# Patient Record
Sex: Male | Born: 1980 | Race: White | Hispanic: No | Marital: Single | Smoking: Current every day smoker
Health system: Southern US, Community
[De-identification: ages and names within clinical notes are randomized; demographics above are authoritative.]

## PROBLEM LIST (undated history)

## (undated) DIAGNOSIS — K219 Gastro-esophageal reflux disease without esophagitis: Secondary | ICD-10-CM

## (undated) DIAGNOSIS — K610 Anal abscess: Secondary | ICD-10-CM

## (undated) HISTORY — DX: Anal abscess: K61.0

## (undated) HISTORY — PX: NO PAST SURGERIES: SHX2092

## (undated) HISTORY — DX: Gastro-esophageal reflux disease without esophagitis: K21.9

## (undated) HISTORY — PX: UPPER GASTROINTESTINAL ENDOSCOPY: SHX188

---

## 2015-05-06 ENCOUNTER — Ambulatory Visit (INDEPENDENT_AMBULATORY_CARE_PROVIDER_SITE_OTHER): Payer: 59 | Admitting: Physician Assistant

## 2015-05-06 VITALS — BP 132/82 | HR 88 | Temp 98.2°F | Resp 16 | Ht 73.0 in | Wt 203.0 lb

## 2015-05-06 DIAGNOSIS — Z8042 Family history of malignant neoplasm of prostate: Secondary | ICD-10-CM | POA: Diagnosis not present

## 2015-05-06 DIAGNOSIS — Z13220 Encounter for screening for lipoid disorders: Secondary | ICD-10-CM

## 2015-05-06 DIAGNOSIS — Z0000 Encounter for general adult medical examination without abnormal findings: Secondary | ICD-10-CM

## 2015-05-06 DIAGNOSIS — Z130 Encounter for screening for diseases of the blood and blood-forming organs and certain disorders involving the immune mechanism: Secondary | ICD-10-CM

## 2015-05-06 DIAGNOSIS — Z13228 Encounter for screening for other metabolic disorders: Secondary | ICD-10-CM | POA: Diagnosis not present

## 2015-05-06 DIAGNOSIS — Z114 Encounter for screening for human immunodeficiency virus [HIV]: Secondary | ICD-10-CM

## 2015-05-06 LAB — COMPLETE METABOLIC PANEL WITH GFR
ALT: 90 U/L — AB (ref 9–46)
AST: 35 U/L (ref 10–40)
Albumin: 4.9 g/dL (ref 3.6–5.1)
Alkaline Phosphatase: 30 U/L — ABNORMAL LOW (ref 40–115)
BILIRUBIN TOTAL: 0.3 mg/dL (ref 0.2–1.2)
BUN: 13 mg/dL (ref 7–25)
CHLORIDE: 101 mmol/L (ref 98–110)
CO2: 24 mmol/L (ref 20–31)
CREATININE: 1.2 mg/dL (ref 0.60–1.35)
Calcium: 9.9 mg/dL (ref 8.6–10.3)
GFR, EST NON AFRICAN AMERICAN: 79 mL/min (ref >=60)
GLUCOSE: 79 mg/dL (ref 65–99)
Potassium: 4.2 mmol/L (ref 3.5–5.3)
SODIUM: 140 mmol/L (ref 135–146)
TOTAL PROTEIN: 7.5 g/dL (ref 6.1–8.1)

## 2015-05-06 LAB — CBC WITH DIFFERENTIAL/PLATELET
BASOS ABS: 0 10*3/uL (ref 0.0–0.1)
Basophils Relative: 1 % (ref 0–1)
EOS ABS: 0.1 10*3/uL (ref 0.0–0.7)
Eosinophils Relative: 2 % (ref 0–5)
HEMATOCRIT: 45.7 % (ref 39.0–52.0)
Hemoglobin: 16.4 g/dL (ref 13.0–17.0)
LYMPHS ABS: 1.3 10*3/uL (ref 0.7–4.0)
LYMPHS PCT: 28 % (ref 12–46)
MCH: 31.4 pg (ref 26.0–34.0)
MCHC: 35.9 g/dL (ref 30.0–36.0)
MCV: 87.5 fL (ref 78.0–100.0)
MONOS PCT: 14 % — AB (ref 3–12)
MPV: 9.1 fL (ref 8.6–12.4)
Monocytes Absolute: 0.6 10*3/uL (ref 0.1–1.0)
NEUTROS PCT: 55 % (ref 43–77)
Neutro Abs: 2.5 10*3/uL (ref 1.7–7.7)
Platelets: 251 10*3/uL (ref 150–400)
RBC: 5.22 MIL/uL (ref 4.22–5.81)
RDW: 13.6 % (ref 11.5–15.5)
WBC: 4.6 10*3/uL (ref 4.0–10.5)

## 2015-05-06 LAB — LIPID PANEL
CHOL/HDL RATIO: 4.8 ratio (ref <=5.0)
Cholesterol: 245 mg/dL — ABNORMAL HIGH (ref 125–200)
HDL: 51 mg/dL (ref >=40)
LDL CALC: 129 mg/dL (ref <130)
Triglycerides: 327 mg/dL — ABNORMAL HIGH (ref <150)
VLDL: 65 mg/dL — AB (ref <30)

## 2015-05-06 NOTE — Progress Notes (Signed)
Frank Galvan  MRN: [id number] DOB: [birth year]  Subjective:  Pt presents to clinic for a CPE.  No concerns.  Last dental exam: every 6 months Last vision exam: never Vaccines        Tetanus - declined      There are no active problems to display for this patient.   No current outpatient prescriptions on file prior to visit.   No current facility-administered medications on file prior to visit.    No Known Allergies  Social History   Social History  . Marital Status: Single    Spouse Name: N/A  . Number of Children: N/A  . Years of Education: N/A   Social History Main Topics  . Smoking status: Current Every Day Smoker -- 0.25 packs/day    Types: Cigarettes  . Smokeless tobacco: Never Used  . Alcohol Use: 0.0 oz/week    0 Standard drinks or equivalent per week     Comment: 6 beers a day  . Drug Use: Yes    Special: Marijuana     Comment: rare social  . Sexual Activity:    Partners: Female   Other Topics Concern  . 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 No pertinent past surgical history.  Family History  Problem Relation Age of Onset  . Cancer Father   . Stroke Maternal Grandfather     Review of Systems  Constitutional: Negative.   HENT: Negative.   Eyes: Negative.   Respiratory: Negative.   Cardiovascular: Negative.   Gastrointestinal: Negative.   Endocrine: Negative.   Genitourinary: Negative.   Musculoskeletal: Negative.   Skin: Negative.   Allergic/Immunologic: Negative.   Neurological: Negative.   Hematological: Negative.   Psychiatric/Behavioral: Negative.    Objective:  BP 132/82 mmHg  Pulse 88  Temp(Src) 98.2 F (36.8 C) (Oral)  Resp 16  Ht 6\' 1"  (1.854 m)  Wt 203 lb (92.08 kg)  BMI 26.79 kg/m2  SpO2 98%  Physical Exam  Constitutional: He is oriented to person, place, and time and well-developed, well-nourished, and in no distress.    HENT:  Head: Normocephalic and atraumatic.  Right Ear: Hearing, tympanic membrane, external ear and ear canal normal.  Left Ear: Hearing, tympanic membrane, external ear and ear canal normal.  Nose: Nose normal.  Mouth/Throat: Uvula is midline, oropharynx is clear and moist and mucous membranes are normal.  Piercing in right lip  Eyes: Conjunctivae and EOM are normal. Pupils are equal, round, and reactive to light.  Neck: Trachea normal and normal range of motion. Neck supple. No thyroid mass and no thyromegaly present.  Cardiovascular: Normal rate, regular rhythm and normal heart sounds.   No murmur heard. Pulmonary/Chest: Effort normal and breath sounds normal.  Abdominal: Soft. Bowel sounds are normal. No hernia. Hernia confirmed negative in the right inguinal area and confirmed negative in the left inguinal area.  Genitourinary: Testes/scrotum normal and penis normal.  Musculoskeletal: Normal range of motion.  Neurological: He is alert and oriented to person, place, and time. Gait normal.  Skin: Skin is warm and dry.  Psychiatric: Mood, memory, affect and judgment normal.    Visual Acuity Screening   Right eye Left eye Both eyes  Without correction: 20/10-1 20/10-1 20/10-1  With correction:       Assessment and Plan :  Annual physical exam  Family history of prostate  cancer - Plan: PSA  Screening for HIV (human immunodeficiency virus) - Plan: HIV antibody  Screening for metabolic disorder - Plan: COMPLETE METABOLIC PANEL WITH GFR  Screening for deficiency anemia - Plan: CBC with Differential/Platelet  Screening cholesterol level - Plan: Lipid panel  Windell Hummingbird PA-C  [facility] 05/07/2015 12:07 PM

## 2015-05-06 NOTE — Patient Instructions (Addendum)
   IF you received an x-ray today, you will receive an invoice from [facility]. Please contact [facility] at [phone] with questions or concerns regarding your invoice.   IF you received labwork today, you will receive an invoice from [facility]. Please contact [facility] at [phone] with questions or concerns regarding your invoice.   Our billing staff will not be able to assist you with questions regarding bills from these companies.  You will be contacted with the lab results as soon as they are available. The fastest way to get your results is to activate your My Chart account. Instructions are located on the last page of this paperwork. If you have not heard from us regarding the results in 2 weeks, please contact this office.    Keeping you healthy  Get these tests  Blood pressure- Have your blood pressure checked once a year by your healthcare provider.  Normal blood pressure is 120/80.  Weight- Have your body mass index (BMI) calculated to screen for obesity.  BMI is a measure of body fat based on height and weight. You can also calculate your own BMI at www.nhlbisupport.com/bmi/.  Cholesterol- Have your cholesterol checked regularly starting at age 35, sooner may be necessary if you have diabetes, high blood pressure, if a family member developed heart diseases at an early age or if you smoke.   Chlamydia, HIV, and other sexual transmitted disease- Get screened each year until the age of 25 then within three months of each new sexual partner.  Diabetes- Have your blood sugar checked regularly if you have high blood pressure, high cholesterol, a family history of diabetes or if you are overweight.  Get these vaccines  Flu shot- Every fall.  Tetanus shot- Every 10 years.  Menactra- Single dose; prevents meningitis.  Take these steps  Don't smoke- If you do smoke, ask your healthcare provider about quitting. For tips on  how to quit, go to www.smokefree.gov or call 1-800-QUIT-NOW.  Be physically active- Exercise 5 days a week for at least 30 minutes.  If you are not already physically active start slow and gradually work up to 30 minutes of moderate physical activity.  Examples of moderate activity include walking briskly, mowing the yard, dancing, swimming bicycling, etc.  Eat a healthy diet- Eat a variety of healthy foods such as fruits, vegetables, low fat milk, low fat cheese, yogurt, lean meats, poultry, fish, beans, tofu, etc.  For more information on healthy eating, go to www.thenutritionsource.org  Drink alcohol in moderation- Limit alcohol intake two drinks or less a day.  Never drink and drive.  Dentist- Brush and floss teeth twice daily; visit your dentis twice a year.  Depression-Your emotional health is as important as your physical health.  If you're feeling down, losing interest in things you normally enjoy please talk with your healthcare provider.  Gun Safety- If you keep a gun in your home, keep it unloaded and with the safety lock on.  Bullets should be stored separately.  Helmet use- Always wear a helmet when riding a motorcycle, bicycle, rollerblading or skateboarding.  Safe sex- If you may be exposed to a sexually transmitted infection, use a condom  Seat belts- Seat bels can save your life; always wear one.  Smoke/Carbon Monoxide detectors- These detectors need to be installed on the appropriate level of your home.  Replace batteries at least once a year.  Skin Cancer- When out in the sun, cover up and use sunscreen SPF   15 or higher.  Violence- If anyone is threatening or hurting you, please tell your healthcare provider. 

## 2015-05-07 ENCOUNTER — Encounter: Payer: Self-pay | Admitting: Physician Assistant

## 2015-05-07 DIAGNOSIS — E782 Mixed hyperlipidemia: Secondary | ICD-10-CM | POA: Insufficient documentation

## 2015-05-07 LAB — HIV ANTIBODY (ROUTINE TESTING W REFLEX): HIV: NONREACTIVE

## 2015-05-07 LAB — PSA: PSA: 0.51 ng/mL (ref <=4.00)

## 2015-07-28 ENCOUNTER — Ambulatory Visit (INDEPENDENT_AMBULATORY_CARE_PROVIDER_SITE_OTHER): Payer: 59 | Admitting: Family Medicine

## 2015-07-28 VITALS — BP 128/64 | HR 81 | Temp 98.4°F | Resp 16 | Ht 73.0 in | Wt 206.4 lb

## 2015-07-28 DIAGNOSIS — J387 Other diseases of larynx: Secondary | ICD-10-CM | POA: Diagnosis not present

## 2015-07-28 DIAGNOSIS — R499 Unspecified voice and resonance disorder: Secondary | ICD-10-CM | POA: Diagnosis not present

## 2015-07-28 DIAGNOSIS — Z720 Tobacco use: Secondary | ICD-10-CM | POA: Diagnosis not present

## 2015-07-28 DIAGNOSIS — R05 Cough: Secondary | ICD-10-CM | POA: Diagnosis not present

## 2015-07-28 DIAGNOSIS — R058 Other specified cough: Secondary | ICD-10-CM

## 2015-07-28 DIAGNOSIS — F1010 Alcohol abuse, uncomplicated: Secondary | ICD-10-CM

## 2015-07-28 DIAGNOSIS — K219 Gastro-esophageal reflux disease without esophagitis: Secondary | ICD-10-CM

## 2015-07-28 MED ORDER — OMEPRAZOLE 20 MG PO CPDR: 20.0000 mg | DELAYED_RELEASE_CAPSULE | Freq: Every day | ORAL | Status: DC

## 2015-07-28 NOTE — Progress Notes (Signed)
By signing my name below, I, Frank Galvan, attest that this documentation has been prepared under the direction and in the presence of Frank Ray, MD.  Electronically Signed: Verlee Galvan, Medical Scribe. 07/28/2015. 3:39 PM.  Subjective:    Patient ID: Frank Galvan, male    DOB: [birth year], 35 y.o.   MRN: [id number]  HPI Chief Complaint  Patient presents with  . Sore Throat    x 24month    HPI Comments: Frank Galvan is a 35 y.o. male who presents to the [facility] complaining of intermittent sore throat onset about 3 months. Pt states its on the left side under his jaw and moves around his throat. Pt occasionally has swollen glands. Pt has sore throat in the morning, and occasionally it wont in the day, but will come back at night. Pt has an occasional dry cough-nothing new. Pt states he has been having more heart burn in the past year. Pt uses Tums and a generic version of Pepcid compete for relief from heartburn. Pt states his heartburn occurs frequently, every day. Pt denies coughing up blood, night sweats, or fever. Pt denies having any hx of cancer.  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 Pt denies being exposed to dust. The places he goes to are monitored for particles and would know ahead of time before heading in. Things have been busy at work.  SHx: Pt uses e-cigarettes now for about a year. Pt has been smoking steady for about 18 years. Pt drinks 6 beers a day. Pt denies having any DUI or any work related incidents. Pt denies having any FHx of EtOH abuse.Pt used to do screaming/death metal music and he felt a pain in he same spot of his throat about a year ago. Pt lost his voice at a bachelor party weekend, and then he started having heartburn. Pt has had a voice change since then. Pt mentions when his parents talk to him on the phone they say he sounds sick.   Patient Active  Problem List   Diagnosis Date Noted  . Elevated triglycerides with high cholesterol 05/07/2015   History reviewed. No pertinent past medical history. History reviewed. No pertinent past surgical history. No Known Allergies Prior to Admission medications   Not on File   Social History   Social History  . Marital Status: Single    Spouse Name: N/A  . Number of Children: N/A  . Years of Education: N/A   Occupational History  . Not on file.   Social History Main Topics  . Smoking status: Current Every Day Smoker -- 0.25 packs/day    Types: Cigarettes  . Smokeless tobacco: Never Used  . Alcohol Use: 0.0 oz/week    0 Standard drinks or equivalent per week     Comment: 6 beers a day  . Drug Use: Yes    Special: Marijuana     Comment: rare social  . Sexual Activity:    Partners: Female   Other Topics Concern  . Not on file   Social History Narrative   Single   No children   Engineer, site   No current exercise       Review of Systems  Constitutional: Negative for fever and diaphoresis.  HENT: Positive for sore throat and voice change. Negative for trouble swallowing.   Respiratory: Positive for cough.   Cardiovascular: Positive for chest pain (due to  heartburn).    Objective:  BP 128/64 mmHg  Pulse 81  Temp(Src) 98.4 F (36.9 C) (Oral)  Resp 16  Ht 6\' 1"  (1.854 m)  Wt 206 lb 6.4 oz (93.622 kg)  BMI 27.24 kg/m2  SpO2 98%  Physical Exam  Constitutional: He is oriented to person, place, and time. He appears well-developed and well-nourished. No distress.  HENT:  Head: Normocephalic and atraumatic.  Right Ear: Tympanic membrane, external ear and ear canal normal.  Left Ear: Tympanic membrane, external ear and ear canal normal.  Nose: No rhinorrhea.  Mouth/Throat: Oropharynx is clear and moist and mucous membranes are normal. No oropharyngeal exudate or posterior oropharyngeal erythema.  No visual masses in his mouth Posterior oropharynx appears nl    Eyes: Conjunctivae are normal. Pupils are equal, round, and reactive to light.  Neck: Neck supple.  Cardiovascular: Normal rate, regular rhythm, normal heart sounds and intact distal pulses.   No murmur heard. Pulmonary/Chest: Effort normal and breath sounds normal. He has no wheezes. He has no rhonchi. He has no rales.  Abdominal: Soft. There is no tenderness.  Lymphadenopathy:    He has no cervical adenopathy.  No appreciable lymph adenopathy No lumps in the neck  Neurological: He is alert and oriented to person, place, and time.  Skin: Skin is warm and dry. No rash noted.  Psychiatric: He has a normal mood and affect. His behavior is normal.  Nursing note and vitals reviewed.   Assessment & Plan:   [name] is a 36 y.o. male Laryngopharyngeal reflux (LPR) - Plan: omeprazole (PRILOSEC) 20 MG capsule, Ambulatory referral to ENT  Dry cough - Plan: Ambulatory referral to ENT  Change in voice - Plan: Ambulatory referral to ENT  Tobacco abuse  Alcohol abuse  Sore throat, with voice change over the past year to year and a half. Initial episode of laryngitis/voice loss, but persistent voice change over past year. Some persistent heartburn symptoms, so laryngopharyngeal reflux likely, but with 15 years of tobacco abuse, will refer to ENT for further evaluation and possible laryngoscopy.  -Refer to ENT  - Start omeprazole 20 mg daily  -Decrease alcohol use and tobacco use. Advised let me know if he has difficulty cutting back on alcohol.  -Handout given on other trigger foods to avoid for heartburn.  -Follow-up in next 6 weeks - sooner if worse.   Meds ordered this encounter  Medications  . omeprazole (PRILOSEC) 20 MG capsule    Sig: Take 1 capsule (20 mg total) by mouth daily.    Dispense:  30 capsule    Refill:  1   Patient Instructions       IF you received an x-Galvan today, you will receive an invoice from [facility]. Please contact [facility] at  [phone] with questions or concerns regarding your invoice.   IF you received labwork today, you will receive an invoice from [facility]. Please contact [facility] at [phone] with questions or concerns regarding your invoice.   Our billing staff will not be able to assist you with questions regarding bills from these companies.  You will be contacted with the lab results as soon as they are available. The fastest way to get your results is to activate your My Chart account. Instructions are located on the last page of this paperwork. If you have not heard from [place] regarding the results in 2 weeks, please contact this office.     Start omeprazole once per day to help  with heartburn. However as we discussed, it is important to cut back on alcohol use as well as tobacco use as much as possible. See other information below on foods to avoid with heartburn. I will also refer you to Ear, Nose and Throat for further evaluation and possible laryngoscopy based on how long you've had these symptoms. Follow-up with me in the next 4-6 weeks, sooner if worse.  Food Choices for Gastroesophageal Reflux Disease, Adult When you have gastroesophageal reflux disease (GERD), the foods you eat and your eating habits are very important. Choosing the right foods can help ease the discomfort of GERD. WHAT GENERAL GUIDELINES DO I NEED TO FOLLOW?  Choose fruits, vegetables, whole grains, low-fat dairy products, and low-fat meat, fish, and poultry.  Limit fats such as oils, salad dressings, butter, nuts, and avocado.  Keep a food diary to identify foods that cause symptoms.  Avoid foods that cause reflux. These may be different for different people.  Eat frequent small meals instead of three large meals each day.  Eat your meals slowly, in a relaxed setting.  Limit fried foods.  Cook foods using methods other than frying.  Avoid drinking alcohol.  Avoid drinking large amounts  of liquids with your meals.  Avoid bending over or lying down until 2-3 hours after eating. WHAT FOODS ARE NOT RECOMMENDED? The following are some foods and drinks that may worsen your symptoms: Vegetables Tomatoes. Tomato juice. Tomato and spaghetti sauce. Chili peppers. Onion and garlic. Horseradish. Fruits Oranges, grapefruit, and lemon (fruit and juice). Meats High-fat meats, fish, and poultry. This includes hot dogs, ribs, ham, sausage, salami, and bacon. Dairy Whole milk and chocolate milk. Sour cream. Cream. Butter. Ice cream. Cream cheese.  Beverages Coffee and tea, with or without caffeine. Carbonated beverages or energy drinks. Condiments Hot sauce. Barbecue sauce.  Sweets/Desserts Chocolate and cocoa. Donuts. Peppermint and spearmint. Fats and Oils High-fat foods, including [place] fries and potato chips. Other Vinegar. Strong spices, such as black pepper, white pepper, red pepper, cayenne, curry powder, cloves, ginger, and chili powder. The items listed above may not be a complete list of foods and beverages to avoid. Contact your dietitian for more information.   This information is not intended to replace advice given to you by your health care provider. Make sure you discuss any questions you have with your health care provider.   Document Released: 01/23/2005 Document Revised: 02/13/2014 Document Reviewed: 11/27/2012 Elsevier Interactive Patient Education 2016 Elsevier Inc.  Sore Throat A sore throat is pain, burning, irritation, or scratchiness of the throat. There is often pain or tenderness when swallowing or talking. A sore throat may be accompanied by other symptoms, such as coughing, sneezing, fever, and swollen neck glands. A sore throat is often the first sign of another sickness, such as a cold, flu, strep throat, or mononucleosis (commonly known as mono). Most sore throats go away without medical treatment. CAUSES  The most common causes of a sore throat  include:  A viral infection, such as a cold, flu, or mono.  A bacterial infection, such as strep throat, tonsillitis, or whooping cough.  Seasonal allergies.  Dryness in the air.  Irritants, such as smoke or pollution.  Gastroesophageal reflux disease (GERD). HOME CARE INSTRUCTIONS   Only take over-the-counter medicines as directed by your caregiver.  Drink enough fluids to keep your urine clear or pale yellow.  Rest as needed.  Try using throat sprays, lozenges, or sucking on hard candy to ease any pain (if  older than 4 years or as directed).  Sip warm liquids, such as broth, herbal tea, or warm water with honey to relieve pain temporarily. You may also eat or drink cold or frozen liquids such as frozen ice pops.  Gargle with salt water (mix 1 tsp salt with 8 oz of water).  Do not smoke and avoid secondhand smoke.  Put a cool-mist humidifier in your bedroom at night to moisten the air. You can also turn on a hot shower and sit in the bathroom with the door closed for 5-10 minutes. SEEK IMMEDIATE MEDICAL CARE IF:  You have difficulty breathing.  You are unable to swallow fluids, soft foods, or your saliva.  You have increased swelling in the throat.  Your sore throat does not get better in 7 days.  You have nausea and vomiting.  You have a fever or persistent symptoms for more than 2-3 days.  You have a fever and your symptoms suddenly get worse. MAKE SURE YOU:   Understand these instructions.  Will watch your condition.  Will get help right away if you are not doing well or get worse.   This information is not intended to replace advice given to you by your health care provider. Make sure you discuss any questions you have with your health care provider.   Document Released: 03/02/2004 Document Revised: 02/13/2014 Document Reviewed: 10/01/2011 Elsevier Interactive Patient Education [employer].      I personally performed the services described in  this documentation, which was scribed in my presence. The recorded information has been reviewed and considered, and addended by me as needed.   Signed,   Frank Ray, MD [facility].  07/28/2015 4:08 PM

## 2015-07-28 NOTE — Patient Instructions (Addendum)
IF you received an x-ray today, you will receive an invoice from [facility]. Please contact [facility] at [phone] with questions or concerns regarding your invoice.   IF you received labwork today, you will receive an invoice from [employer]. Please contact [facility] at [phone] with questions or concerns regarding your invoice.   Our billing staff will not be able to assist you with questions regarding bills from these companies.  You will be contacted with the lab results as soon as they are available. The fastest way to get your results is to activate your My Chart account. Instructions are located on the last page of this paperwork. If you have not heard from [place] regarding the results in 2 weeks, please contact this office.     Start omeprazole once per day to help with heartburn. However as we discussed, it is important to cut back on alcohol use as well as tobacco use as much as possible. See other information below on foods to avoid with heartburn. I will also refer you to Ear, Nose and Throat for further evaluation and possible laryngoscopy based on how long you've had these symptoms. Follow-up with me in the next 4-6 weeks, sooner if worse.  Food Choices for Gastroesophageal Reflux Disease, Adult When you have gastroesophageal reflux disease (GERD), the foods you eat and your eating habits are very important. Choosing the right foods can help ease the discomfort of GERD. WHAT GENERAL GUIDELINES DO I NEED TO FOLLOW?  Choose fruits, vegetables, whole grains, low-fat dairy products, and low-fat meat, fish, and poultry.  Limit fats such as oils, salad dressings, butter, nuts, and avocado.  Keep a food diary to identify foods that cause symptoms.  Avoid foods that cause reflux. These may be different for different people.  Eat frequent small meals instead of three large meals each day.  Eat your meals slowly, in a relaxed  setting.  Limit fried foods.  Cook foods using methods other than frying.  Avoid drinking alcohol.  Avoid drinking large amounts of liquids with your meals.  Avoid bending over or lying down until 2-3 hours after eating. WHAT FOODS ARE NOT RECOMMENDED? The following are some foods and drinks that may worsen your symptoms: Vegetables Tomatoes. Tomato juice. Tomato and spaghetti sauce. Chili peppers. Onion and garlic. Horseradish. Fruits Oranges, grapefruit, and lemon (fruit and juice). Meats High-fat meats, fish, and poultry. This includes hot dogs, ribs, ham, sausage, salami, and bacon. Dairy Whole milk and chocolate milk. Sour cream. Cream. Butter. Ice cream. Cream cheese.  Beverages Coffee and tea, with or without caffeine. Carbonated beverages or energy drinks. Condiments Hot sauce. Barbecue sauce.  Sweets/Desserts Chocolate and cocoa. Donuts. Peppermint and spearmint. Fats and Oils High-fat foods, including [place] fries and potato chips. Other Vinegar. Strong spices, such as black pepper, white pepper, red pepper, cayenne, curry powder, cloves, ginger, and chili powder. The items listed above may not be a complete list of foods and beverages to avoid. Contact your dietitian for more information.   This information is not intended to replace advice given to you by your health care provider. Make sure you discuss any questions you have with your health care provider.   Document Released: 01/23/2005 Document Revised: 02/13/2014 Document Reviewed: 11/27/2012 Elsevier Interactive Patient Education 2016 Elsevier Inc.  Sore Throat A sore throat is pain, burning, irritation, or scratchiness of the throat. There is often pain or tenderness when swallowing or talking. A sore throat may be accompanied  by other symptoms, such as coughing, sneezing, fever, and swollen neck glands. A sore throat is often the first sign of another sickness, such as a cold, flu, strep throat, or  mononucleosis (commonly known as mono). Most sore throats go away without medical treatment. CAUSES  The most common causes of a sore throat include:  A viral infection, such as a cold, flu, or mono.  A bacterial infection, such as strep throat, tonsillitis, or whooping cough.  Seasonal allergies.  Dryness in the air.  Irritants, such as smoke or pollution.  Gastroesophageal reflux disease (GERD). HOME CARE INSTRUCTIONS   Only take over-the-counter medicines as directed by your caregiver.  Drink enough fluids to keep your urine clear or pale yellow.  Rest as needed.  Try using throat sprays, lozenges, or sucking on hard candy to ease any pain (if older than 4 years or as directed).  Sip warm liquids, such as broth, herbal tea, or warm water with honey to relieve pain temporarily. You may also eat or drink cold or frozen liquids such as frozen ice pops.  Gargle with salt water (mix 1 tsp salt with 8 oz of water).  Do not smoke and avoid secondhand smoke.  Put a cool-mist humidifier in your bedroom at night to moisten the air. You can also turn on a hot shower and sit in the bathroom with the door closed for 5-10 minutes. SEEK IMMEDIATE MEDICAL CARE IF:  You have difficulty breathing.  You are unable to swallow fluids, soft foods, or your saliva.  You have increased swelling in the throat.  Your sore throat does not get better in 7 days.  You have nausea and vomiting.  You have a fever or persistent symptoms for more than 2-3 days.  You have a fever and your symptoms suddenly get worse. MAKE SURE YOU:   Understand these instructions.  Will watch your condition.  Will get help right away if you are not doing well or get worse.   This information is not intended to replace advice given to you by your health care provider. Make sure you discuss any questions you have with your health care provider.   Document Released: 03/02/2004 Document Revised: 02/13/2014  Document Reviewed: 10/01/2011 Elsevier Interactive Patient Education [employer].

## 2015-07-29 ENCOUNTER — Telehealth: Payer: Self-pay

## 2015-07-29 NOTE — Telephone Encounter (Signed)
Pt has questions about the medication that was given on 07/28/15 a bout how to take it with or without food  Best number [phone]

## 2015-07-30 ENCOUNTER — Telehealth: Payer: Self-pay | Admitting: *Deleted

## 2015-07-30 NOTE — Telephone Encounter (Signed)
Called left message in voice mail to take medication at least 30 minutes before eating and avoid fatty foods, decrease alcohol intake and citrus foods, per AVS directions given to him at the office visit.

## 2015-08-17 NOTE — Telephone Encounter (Signed)
[facility] states patient was referred to them and is unable to reach the patient via phone.  They will try to reach the patient via e-mail.

## 2015-09-29 ENCOUNTER — Other Ambulatory Visit: Payer: Self-pay | Admitting: Family Medicine

## 2015-09-29 DIAGNOSIS — K219 Gastro-esophageal reflux disease without esophagitis: Secondary | ICD-10-CM

## 2016-04-28 ENCOUNTER — Ambulatory Visit (INDEPENDENT_AMBULATORY_CARE_PROVIDER_SITE_OTHER): Payer: 59 | Admitting: Family Medicine

## 2016-04-28 VITALS — BP 124/80 | HR 80 | Temp 98.3°F | Resp 17 | Ht 73.0 in | Wt 207.0 lb

## 2016-04-28 DIAGNOSIS — Z8042 Family history of malignant neoplasm of prostate: Secondary | ICD-10-CM

## 2016-04-28 DIAGNOSIS — Z0000 Encounter for general adult medical examination without abnormal findings: Secondary | ICD-10-CM

## 2016-04-28 DIAGNOSIS — E781 Pure hyperglyceridemia: Secondary | ICD-10-CM

## 2016-04-28 DIAGNOSIS — F17200 Nicotine dependence, unspecified, uncomplicated: Secondary | ICD-10-CM | POA: Diagnosis not present

## 2016-04-28 NOTE — Progress Notes (Signed)
Chief Complaint  Patient presents with  . Annual Exam    Subjective:  Frank Galvan is a 36 y.o. male here for a health maintenance visit.  Patient is established pt  Patient Active Problem List   Diagnosis Date Noted  . Elevated triglycerides with high cholesterol 05/07/2015    No past medical history on file.  No past surgical history on file.   Outpatient Medications Prior to Visit  Medication Sig Dispense Refill  . omeprazole (PRILOSEC) 20 MG capsule Take 1 capsule (20 mg total) by mouth daily. 30 capsule 1   No facility-administered medications prior to visit.     No Known Allergies   Family History  Problem Relation Age of Onset  . Cancer Father   . Stroke Maternal Grandfather      Health Habits: Dental Exam: up to date Eye Exam: up to date Exercise:  times/week on average Current exercise activities: walking/running Diet: occasionally consumes balanced  Social History   Social History  . Marital status: Single    Spouse name: N/A  . Number of children: N/A  . Years of education: N/A   Occupational History  . Not on file.   Social History Main Topics  . Smoking status: Current Every Day Smoker    Packs/day: 0.25    Years: 16.00    Types: Cigarettes  . Smokeless tobacco: Never Used  . Alcohol use 0.0 oz/week     Comment: 6 beers a day  . Drug use: Yes    Types: Marijuana     Comment: rare social  . Sexual activity: Yes    Partners: Female   Other Topics Concern  . Not on file   Social History Narrative   Single   No children   Engineer, site   No current exercise      History  Alcohol Use  . 0.0 oz/week    Comment: 6 beers a day   History  Smoking Status  . Current Every Day Smoker  . Packs/day: 0.25  . Years: 16.00  . Types: Cigarettes  Smokeless Tobacco  . Never Used   History  Drug Use  . Types: Marijuana    Comment: rare social     Health Maintenance: See under health Maintenance activity for review of  completion dates as well.  There is no immunization history on file for this patient.    Depression Screen-PHQ2/9 Depression screen [facility] 2/9 04/28/2016 07/28/2015 05/06/2015  Decreased Interest 0 0 0  Down, Depressed, Hopeless 0 0 0  PHQ - 2 Score 0 0 0      Depression Severity and Treatment Recommendations:  0-4= None  5-9= Mild / Treatment: Support, educate to call if worse; return in one month  10-14= Moderate / Treatment: Support, watchful waiting; Antidepressant or Psycotherapy  15-19= Moderately severe / Treatment: Antidepressant OR Psychotherapy  >= 20 = Major depression, severe / Antidepressant AND Psychotherapy    Review of Systems   Review of Systems  Constitutional: Negative for chills, fever and weight loss.  HENT: Negative for ear discharge, ear pain, nosebleeds and tinnitus.   Eyes: Negative for blurred vision, double vision, photophobia, pain, discharge and redness.  Respiratory: Negative for cough, shortness of breath and wheezing.   Cardiovascular: Negative for chest pain, palpitations, claudication and leg swelling.  Gastrointestinal: Negative for abdominal pain, diarrhea, heartburn, nausea and vomiting.  Genitourinary: Negative for dysuria, frequency and urgency.  Musculoskeletal: Negative for back pain, joint pain, myalgias and neck pain.  Skin: Negative for itching and rash.  Neurological: Negative for dizziness, tingling and headaches.  Psychiatric/Behavioral: Negative for depression. The patient is not nervous/anxious and does not have insomnia.     See HPI for ROS as well.    Objective:   Vitals:   04/28/16 0814  BP: 124/80  Pulse: 80  Resp: 17  Temp: 98.3 F (36.8 C)  TempSrc: Oral  SpO2: 97%  Weight: 207 lb (93.9 kg)  Height: 6\' 1"  (1.854 m)    Body mass index is 27.31 kg/m.  Physical Exam  Constitutional: He is oriented to person, place, and time. He appears well-developed and well-nourished.  HENT:  Head: Normocephalic and  atraumatic.  Right Ear: External ear normal.  Left Ear: External ear normal.  Nose: Nose normal.  Mouth/Throat: Oropharynx is clear and moist. No oropharyngeal exudate.  Eyes: Conjunctivae and EOM are normal. Right eye exhibits no discharge. Left eye exhibits no discharge.  Neck: Normal range of motion. Neck supple.  Cardiovascular: Normal rate, regular rhythm, normal heart sounds and intact distal pulses.   No murmur heard. Pulmonary/Chest: Effort normal and breath sounds normal. No respiratory distress. He has no wheezes.  Abdominal: Soft. Bowel sounds are normal. He exhibits no distension and no mass. There is no tenderness. There is no rebound and no guarding.  Musculoskeletal: Normal range of motion. He exhibits no edema.  Neurological: He is alert and oriented to person, place, and time. He has normal reflexes.  Skin: Skin is warm. No erythema.  Psychiatric: He has a normal mood and affect. His behavior is normal. Judgment and thought content normal.       Assessment/Plan:   Patient was seen for a health maintenance exam.  Counseled the patient on health maintenance issues. Reviewed her health mainteance schedule and ordered appropriate tests (see orders.) Counseled on regular exercise and weight management. Recommend regular eye exams and dental cleaning.   The following issues were addressed today for health maintenance:   Frank Galvan was seen today for annual exam.  Diagnoses and all orders for this visit:  Health maintenance examination- discussed age appropriate screenings Repeating labs from last year -     Lipid panel -     Cancel: Basic metabolic panel -     TSH -     Comprehensive metabolic panel -     CBC with Differential/Platelet  Family history of prostate cancer- will screen today and start to screen every 2 years if results normal -     PSA  Smoker- discussed that he should stop smoking especially given his family history of prostate  cancer  Hypertriglyceridemia- discussed increasing fiber, fish oil     No Follow-up on file.    Body mass index is 27.31 kg/m.:  Discussed the patient's BMI with patient. The BMI body mass index is 27.31 kg/m.     No future appointments.  Patient Instructions       IF you received an x-ray today, you will receive an invoice from [facility]. Please contact [facility] at [phone] with questions or concerns regarding your invoice.   IF you received labwork today, you will receive an invoice from [facility]. Please contact [facility] at [phone] with questions or concerns regarding your invoice.   Our billing staff will not be able to assist you with questions regarding bills from these companies.  You will be contacted with the lab results as soon as they are available. The fastest way to get your results is to activate  your My Chart account. Instructions are located on the last page of this paperwork. If you have not heard from [place] regarding the results in 2 weeks, please contact this office.      Health Maintenance, Male A healthy lifestyle and preventive care is important for your health and wellness. Ask your health care provider about what schedule of regular examinations is right for you. What should I know about weight and diet?  Eat a Healthy Diet  Eat plenty of vegetables, fruits, whole grains, low-fat dairy products, and lean protein.  Do not eat a lot of foods high in solid fats, added sugars, or salt. Maintain a Healthy Weight  Regular exercise can help you achieve or maintain a healthy weight. You should:  Do at least 150 minutes of exercise each week. The exercise should increase your heart rate and make you sweat (moderate-intensity exercise).  Do strength-training exercises at least twice a week. Watch Your Levels of Cholesterol and Blood Lipids  Have your blood tested for lipids and cholesterol every 5 years starting at 34 years  of age. If you are at high risk for heart disease, you should start having your blood tested when you are 34 years old. You may need to have your cholesterol levels checked more often if:  Your lipid or cholesterol levels are high.  You are older than 34 years of age.  You are at high risk for heart disease. What should I know about cancer screening? Many types of cancers can be detected early and may often be prevented. Lung Cancer  You should be screened every year for lung cancer if:  You are a current smoker who has smoked for at least 30 years.  You are a former smoker who has quit within the past 15 years.  Talk to your health care provider about your screening options, when you should start screening, and how often you should be screened. Colorectal Cancer  Routine colorectal cancer screening usually begins at 34 years of age and should be repeated every 5-10 years until you are 34 years old. You may need to be screened more often if early forms of precancerous polyps or small growths are found. Your health care provider may recommend screening at an earlier age if you have risk factors for colon cancer.  Your health care provider may recommend using home test kits to check for hidden blood in the stool.  A small camera at the end of a tube can be used to examine your colon (sigmoidoscopy or colonoscopy). This checks for the earliest forms of colorectal cancer. Prostate and Testicular Cancer  Depending on your age and overall health, your health care provider may do certain tests to screen for prostate and testicular cancer.  Talk to your health care provider about any symptoms or concerns you have about testicular or prostate cancer. Skin Cancer  Check your skin from head to toe regularly.  Tell your health care provider about any new moles or changes in moles, especially if:  There is a change in a mole's size, shape, or color.  You have a mole that is larger than a  pencil eraser.  Always use sunscreen. Apply sunscreen liberally and repeat throughout the day.  Protect yourself by wearing long sleeves, pants, a wide-brimmed hat, and sunglasses when outside. What should I know about heart disease, diabetes, and high blood pressure?  If you are 97-77 years of age, have your blood pressure checked every 3-5 years. If you are  6 years of age or older, have your blood pressure checked every year. You should have your blood pressure measured twice-once when you are at a hospital or clinic, and once when you are not at a hospital or clinic. Record the average of the two measurements. To check your blood pressure when you are not at a hospital or clinic, you can use:  An automated blood pressure machine at a pharmacy.  A home blood pressure monitor.  Talk to your health care provider about your target blood pressure.  If you are between 22-66 years old, ask your health care provider if you should take aspirin to prevent heart disease.  Have regular diabetes screenings by checking your fasting blood sugar level.  If you are at a normal weight and have a low risk for diabetes, have this test once every three years after the age of 9.  If you are overweight and have a high risk for diabetes, consider being tested at a younger age or more often.  A one-time screening for abdominal aortic aneurysm (AAA) by ultrasound is recommended for men aged 79-75 years who are current or former smokers. What should I know about preventing infection? Hepatitis B  If you have a higher risk for hepatitis B, you should be screened for this virus. Talk with your health care provider to find out if you are at risk for hepatitis B infection. Hepatitis C  Blood testing is recommended for:  Everyone born from [birth year] through 1963.  Anyone with known risk factors for hepatitis C. Sexually Transmitted Diseases (STDs)  You should be screened each year for STDs including gonorrhea and  chlamydia if:  You are sexually active and are younger than 34 years of age.  You are older than 34 years of age and your health care provider tells you that you are at risk for this type of infection.  Your sexual activity has changed since you were last screened and you are at an increased risk for chlamydia or gonorrhea. Ask your health care provider if you are at risk.  Talk with your health care provider about whether you are at high risk of being infected with HIV. Your health care provider may recommend a prescription medicine to help prevent HIV infection. What else can I do?  Schedule regular health, dental, and eye exams.  Stay current with your vaccines (immunizations).  Do not use any tobacco products, such as cigarettes, chewing tobacco, and e-cigarettes. If you need help quitting, ask your health care provider.  Limit alcohol intake to no more than 2 drinks per day. One drink equals 12 ounces of beer, 5 ounces of wine, or 1 ounces of hard liquor.  Do not use street drugs.  Do not share needles.  Ask your health care provider for help if you need support or information about quitting drugs.  Tell your health care provider if you often feel depressed.  Tell your health care provider if you have ever been abused or do not feel safe at home. This information is not intended to replace advice given to you by your health care provider. Make sure you discuss any questions you have with your health care provider. Document Released: 07/22/2007 Document Revised: 09/22/2015 Document Reviewed: 10/27/2014 Elsevier Interactive Patient Education  2017 [employer].

## 2016-04-28 NOTE — Patient Instructions (Addendum)
IF you received an x-ray today, you will receive an invoice from [facility] Radiology. Please contact [facility]iology at [phone] with questions or concerns regarding your invoice.   IF you received labwork today, you will receive an invoice from [facility]. Please contact [facility] at [phone] with questions or concerns regarding your invoice.   Our billing staff will not be able to assist you with questions regarding bills from these companies.  You will be contacted with the lab results as soon as they are available. The fastest way to get your results is to activate your My Chart account. Instructions are located on the last page of this paperwork. If you have not heard from [place] regarding the results in 2 weeks, please contact this office.      Health Maintenance, Male A healthy lifestyle and preventive care is important for your health and wellness. Ask your health care provider about what schedule of regular examinations is right for you. What should I know about weight and diet?  Eat a Healthy Diet  Eat plenty of vegetables, fruits, whole grains, low-fat dairy products, and lean protein.  Do not eat a lot of foods high in solid fats, added sugars, or salt. Maintain a Healthy Weight  Regular exercise can help you achieve or maintain a healthy weight. You should:  Do at least 150 minutes of exercise each week. The exercise should increase your heart rate and make you sweat (moderate-intensity exercise).  Do strength-training exercises at least twice a week. Watch Your Levels of Cholesterol and Blood Lipids  Have your blood tested for lipids and cholesterol every 5 years starting at 34 years of age. If you are at high risk for heart disease, you should start having your blood tested when you are 35 years old. You may need to have your cholesterol levels checked more often if:  Your lipid or cholesterol levels are high.  You are older than 34 years of age.  You  are at high risk for heart disease. What should I know about cancer screening? Many types of cancers can be detected early and may often be prevented. Lung Cancer  You should be screened every year for lung cancer if:  You are a current smoker who has smoked for at least 30 years.  You are a former smoker who has quit within the past 15 years.  Talk to your health care provider about your screening options, when you should start screening, and how often you should be screened. Colorectal Cancer  Routine colorectal cancer screening usually begins at 34 years of age and should be repeated every 5-10 years until you are 34 years old. You may need to be screened more often if early forms of precancerous polyps or small growths are found. Your health care provider may recommend screening at an earlier age if you have risk factors for colon cancer.  Your health care provider may recommend using home test kits to check for hidden blood in the stool.  A small camera at the end of a tube can be used to examine your colon (sigmoidoscopy or colonoscopy). This checks for the earliest forms of colorectal cancer. Prostate and Testicular Cancer  Depending on your age and overall health, your health care provider may do certain tests to screen for prostate and testicular cancer.  Talk to your health care provider about any symptoms or concerns you have about testicular or prostate cancer. Skin Cancer  Check your skin from head to toe  regularly.  Tell your health care provider about any new moles or changes in moles, especially if:  There is a change in a mole's size, shape, or color.  You have a mole that is larger than a pencil eraser.  Always use sunscreen. Apply sunscreen liberally and repeat throughout the day.  Protect yourself by wearing long sleeves, pants, a wide-brimmed hat, and sunglasses when outside. What should I know about heart disease, diabetes, and high blood pressure?  If you  are 38-56 years of age, have your blood pressure checked every 3-5 years. If you are 71 years of age or older, have your blood pressure checked every year. You should have your blood pressure measured twice-once when you are at a hospital or clinic, and once when you are not at a hospital or clinic. Record the average of the two measurements. To check your blood pressure when you are not at a hospital or clinic, you can use:  An automated blood pressure machine at a pharmacy.  A home blood pressure monitor.  Talk to your health care provider about your target blood pressure.  If you are between 37-42 years old, ask your health care provider if you should take aspirin to prevent heart disease.  Have regular diabetes screenings by checking your fasting blood sugar level.  If you are at a normal weight and have a low risk for diabetes, have this test once every three years after the age of 41.  If you are overweight and have a high risk for diabetes, consider being tested at a younger age or more often.  A one-time screening for abdominal aortic aneurysm (AAA) by ultrasound is recommended for men aged 40-75 years who are current or former smokers. What should I know about preventing infection? Hepatitis B  If you have a higher risk for hepatitis B, you should be screened for this virus. Talk with your health care provider to find out if you are at risk for hepatitis B infection. Hepatitis C  Blood testing is recommended for:  Everyone born from [birth year] through 1963.  Anyone with known risk factors for hepatitis C. Sexually Transmitted Diseases (STDs)  You should be screened each year for STDs including gonorrhea and chlamydia if:  You are sexually active and are younger than 34 years of age.  You are older than 34 years of age and your health care provider tells you that you are at risk for this type of infection.  Your sexual activity has changed since you were last screened and you are  at an increased risk for chlamydia or gonorrhea. Ask your health care provider if you are at risk.  Talk with your health care provider about whether you are at high risk of being infected with HIV. Your health care provider may recommend a prescription medicine to help prevent HIV infection. What else can I do?  Schedule regular health, dental, and eye exams.  Stay current with your vaccines (immunizations).  Do not use any tobacco products, such as cigarettes, chewing tobacco, and e-cigarettes. If you need help quitting, ask your health care provider.  Limit alcohol intake to no more than 2 drinks per day. One drink equals 12 ounces of beer, 5 ounces of wine, or 1 ounces of hard liquor.  Do not use street drugs.  Do not share needles.  Ask your health care provider for help if you need support or information about quitting drugs.  Tell your health care provider if you often feel  depressed.  Tell your health care provider if you have ever been abused or do not feel safe at home. This information is not intended to replace advice given to you by your health care provider. Make sure you discuss any questions you have with your health care provider. Document Released: 07/22/2007 Document Revised: 09/22/2015 Document Reviewed: 10/27/2014 Elsevier Interactive Patient Education  2017 [employer] Choices to Lower Your Triglycerides Triglycerides are a type of fat in your blood. High levels of triglycerides can increase the risk of heart disease and stroke. If your triglyceride levels are high, the foods you eat and your eating habits are very important. Choosing the right foods can help lower your triglycerides. What general guidelines do I need to follow?  Lose weight if you are overweight.  Limit or avoid alcohol.  Fill one half of your plate with vegetables and green salads.  Limit fruit to two servings a day. Choose fruit instead of juice.  Make one fourth of your plate  whole grains. Look for the word "whole" as the first word in the ingredient list.  Fill one fourth of your plate with lean protein foods.  Enjoy fatty fish (such as salmon, mackerel, sardines, and tuna) three times a week.  Choose healthy fats.  Limit foods high in starch and sugar.  Eat more home-cooked food and less restaurant, buffet, and fast food.  Limit fried foods.  Cook foods using methods other than frying.  Limit saturated fats.  Check ingredient lists to avoid foods with partially hydrogenated oils (trans fats) in them. What foods can I eat? Grains  Whole grains, such as whole wheat or whole grain breads, crackers, cereals, and pasta. Unsweetened oatmeal, bulgur, barley, quinoa, or brown rice. Corn or whole wheat flour tortillas. Vegetables  Fresh or frozen vegetables (raw, steamed, roasted, or grilled). Green salads. Fruits  All fresh, canned (in natural juice), or frozen fruits. Meat and Other Protein Products  Ground beef (85% or leaner), grass-fed beef, or beef trimmed of fat. Skinless chicken or [place]. Ground chicken or [place]. Pork trimmed of fat. All fish and seafood. Eggs. Dried beans, peas, or lentils. Unsalted nuts or seeds. Unsalted canned or dry beans. Dairy  Low-fat dairy products, such as skim or 1% milk, 2% or reduced-fat cheeses, low-fat ricotta or cottage cheese, or plain low-fat yogurt. Fats and Oils  Tub margarines without trans fats. Light or reduced-fat mayonnaise and salad dressings. Avocado. Safflower, olive, or canola oils. Natural peanut or almond butter. The items listed above may not be a complete list of recommended foods or beverages. Contact your dietitian for more options.  What foods are not recommended? Grains  White bread. White pasta. White rice. Cornbread. Bagels, pastries, and croissants. Crackers that contain trans fat. Vegetables  White potatoes. Corn. Creamed or fried vegetables. Vegetables in a cheese sauce. Fruits  Dried  fruits. Canned fruit in light or heavy syrup. Fruit juice. Meat and Other Protein Products  Fatty cuts of meat. Ribs, chicken wings, bacon, sausage, bologna, salami, chitterlings, fatback, hot dogs, bratwurst, and packaged luncheon meats. Dairy  Whole or 2% milk, cream, half-and-half, and cream cheese. Whole-fat or sweetened yogurt. Full-fat cheeses. Nondairy creamers and whipped toppings. Processed cheese, cheese spreads, or cheese curds. Sweets and Desserts  Corn syrup, sugars, honey, and molasses. Candy. Jam and jelly. Syrup. Sweetened cereals. Cookies, pies, cakes, donuts, muffins, and ice cream. Fats and Oils  Butter, stick margarine, lard, shortening, ghee, or bacon fat. Coconut, palm kernel, or palm oils.  Beverages  Alcohol. Sweetened drinks (such as sodas, lemonade, and fruit drinks or punches). The items listed above may not be a complete list of foods and beverages to avoid. Contact your dietitian for more information.  This information is not intended to replace advice given to you by your health care provider. Make sure you discuss any questions you have with your health care provider. Document Released: 11/11/2003 Document Revised: 07/01/2015 Document Reviewed: 11/27/2012 Elsevier Interactive Patient Education  2017 [employer].

## 2016-04-29 LAB — CBC WITH DIFFERENTIAL/PLATELET
BASOS ABS: 0 10*3/uL (ref 0.0–0.2)
Basos: 1 %
EOS (ABSOLUTE): 0.1 10*3/uL (ref 0.0–0.4)
Eos: 3 %
Hematocrit: 48.3 % (ref 37.5–51.0)
Hemoglobin: 16.7 g/dL (ref 13.0–17.7)
Immature Grans (Abs): 0 10*3/uL (ref 0.0–0.1)
Immature Granulocytes: 0 %
LYMPHS ABS: 1.2 10*3/uL (ref 0.7–3.1)
Lymphs: 31 %
MCH: 30.4 pg (ref 26.6–33.0)
MCHC: 34.6 g/dL (ref 31.5–35.7)
MCV: 88 fL (ref 79–97)
MONOS ABS: 0.5 10*3/uL (ref 0.1–0.9)
Monocytes: 13 %
NEUTROS PCT: 52 %
Neutrophils Absolute: 2 10*3/uL (ref 1.4–7.0)
PLATELETS: 248 10*3/uL (ref 150–379)
RBC: 5.5 x10E6/uL (ref 4.14–5.80)
RDW: 13.1 % (ref 12.3–15.4)
WBC: 3.8 10*3/uL (ref 3.4–10.8)

## 2016-04-29 LAB — COMPREHENSIVE METABOLIC PANEL
ALK PHOS: 35 IU/L — AB (ref 39–117)
ALT: 116 IU/L — ABNORMAL HIGH (ref 0–44)
AST: 53 IU/L — AB (ref 0–40)
Albumin/Globulin Ratio: 2.1 (ref 1.2–2.2)
Albumin: 5.1 g/dL (ref 3.5–5.5)
BUN / CREAT RATIO: 11 (ref 9–20)
BUN: 13 mg/dL (ref 6–20)
Bilirubin Total: 0.3 mg/dL (ref 0.0–1.2)
CO2: 25 mmol/L (ref 18–29)
Calcium: 9.7 mg/dL (ref 8.7–10.2)
Chloride: 97 mmol/L (ref 96–106)
Creatinine, Ser: 1.16 mg/dL (ref 0.76–1.27)
GFR calc Af Amer: 94 mL/min/{1.73_m2} (ref >59)
GFR, EST NON AFRICAN AMERICAN: 82 mL/min/{1.73_m2} (ref >59)
GLOBULIN, TOTAL: 2.4 g/dL (ref 1.5–4.5)
Glucose: 110 mg/dL — ABNORMAL HIGH (ref 65–99)
POTASSIUM: 4.4 mmol/L (ref 3.5–5.2)
SODIUM: 138 mmol/L (ref 134–144)
Total Protein: 7.5 g/dL (ref 6.0–8.5)

## 2016-04-29 LAB — PSA: Prostate Specific Ag, Serum: 0.5 ng/mL (ref 0.0–4.0)

## 2016-04-29 LAB — LIPID PANEL
CHOL/HDL RATIO: 5.6 ratio — AB (ref 0.0–5.0)
Cholesterol, Total: 259 mg/dL — ABNORMAL HIGH (ref 100–199)
HDL: 46 mg/dL (ref >39)
LDL Calculated: 151 mg/dL — ABNORMAL HIGH (ref 0–99)
Triglycerides: 309 mg/dL — ABNORMAL HIGH (ref 0–149)
VLDL CHOLESTEROL CAL: 62 mg/dL — AB (ref 5–40)

## 2016-04-29 LAB — TSH: TSH: 2.69 u[IU]/mL (ref 0.450–4.500)

## 2016-08-28 ENCOUNTER — Ambulatory Visit (INDEPENDENT_AMBULATORY_CARE_PROVIDER_SITE_OTHER): Payer: 59 | Admitting: Physician Assistant

## 2016-08-28 ENCOUNTER — Encounter: Payer: Self-pay | Admitting: Physician Assistant

## 2016-08-28 VITALS — BP 122/86 | HR 80 | Temp 98.4°F | Resp 18 | Ht 73.0 in | Wt 192.6 lb

## 2016-08-28 DIAGNOSIS — R1013 Epigastric pain: Secondary | ICD-10-CM

## 2016-08-28 DIAGNOSIS — K2920 Alcoholic gastritis without bleeding: Secondary | ICD-10-CM

## 2016-08-28 DIAGNOSIS — Z789 Other specified health status: Secondary | ICD-10-CM | POA: Diagnosis not present

## 2016-08-28 DIAGNOSIS — Z7289 Other problems related to lifestyle: Secondary | ICD-10-CM

## 2016-08-28 MED ORDER — ONDANSETRON 8 MG PO TBDP: 8.0000 mg | ORAL_TABLET | Freq: Three times a day (TID) | ORAL | 0 refills | Status: DC | PRN

## 2016-08-28 MED ORDER — RANITIDINE HCL 150 MG PO TABS: 150.0000 mg | ORAL_TABLET | Freq: Two times a day (BID) | ORAL | 1 refills | Status: DC

## 2016-08-28 NOTE — Progress Notes (Signed)
PRIMARY CARE AT [place], [place] [address] [phone]  Date:  08/28/2016   Name:  Frank Galvan   DOB:  [birth year]   MRN:  [id number]  PCP:  Patient, No Pcp Per    History of Present Illness:  Frank Galvan is a 36 y.o. male patient who presents to PCP with  Chief Complaint  Patient presents with  . Abdominal Pain    lower middle and left lower side pain pain comes and goes   . Chest Pain    on left side feels like mescular pain according to patient      Patient had nausea and lightheadedness that started this morning.  No sob or diaphoresis.  No palpitaitons.   He has abdominal pain that migrates.  He has pain on his left side that he decribes as a muscular ache.  The upper left side of his abdomen is also painful and in his lower sternum.  Burning sensation started more than 1 month ago.   Nausea is minimal etOH intake is 6-9 during the week, weekend night 13-20.  He has tried to cut back during the week, but continues his etOH intake during the weekend. Hx of elevated triglycerides as well.  No change in diet from prior.    Patient Active Problem List   Diagnosis Date Noted  . Elevated triglycerides with high cholesterol 05/07/2015    No past medical history on file.  No past surgical history on file.  Social History  Substance Use Topics  . Smoking status: Current Every Day Smoker    Packs/day: 0.25    Years: 16.00    Types: Cigarettes  . Smokeless tobacco: Never Used  . Alcohol use 0.0 oz/week     Comment: 6 beers a day    Family History  Problem Relation Age of Onset  . Cancer Father   . Stroke Maternal Grandfather     No Known Allergies  Medication list has been reviewed and updated.  Current Outpatient Prescriptions on File Prior to Visit  Medication Sig Dispense Refill  . omeprazole (PRILOSEC) 20 MG capsule Take 1 capsule (20 mg total) by mouth daily. 30 capsule 1   No current facility-administered medications on file prior to  visit.     ROS ROS otherwise unremarkable unless listed above.  Physical Examination: BP 122/86   Pulse 80   Temp 98.4 F (36.9 C) (Oral)   Resp 18   Ht '6\' 1"'  (1.854 m)   Wt 192 lb 9.6 oz (87.4 kg)   SpO2 97%   BMI 25.41 kg/m  Ideal Body Weight: Weight in (lb) to have BMI = 25: 189.1  Physical Exam  Constitutional: He is oriented to person, place, and time. He appears well-developed and well-nourished. No distress.  HENT:  Head: Normocephalic and atraumatic.  Eyes: Pupils are equal, round, and reactive to light. Conjunctivae and EOM are normal.  Cardiovascular: Normal rate.   Pulmonary/Chest: Effort normal. No respiratory distress.  Abdominal: Soft. Normal appearance and bowel sounds are normal. There is tenderness in the epigastric area and periumbilical area.  Neurological: He is alert and oriented to person, place, and time.  Skin: Skin is warm and dry. He is not diaphoretic.  Psychiatric: He has a normal mood and affect. His behavior is normal.     Assessment and Plan: Frank Galvan is a 36 y.o. male who is here today for cc of abdominal pain Likely gastritis.  Changed to h2 blocker.  Advised alcohol  limits. Offered facilitation of cutting back on etOH, as well as programs to help with his reduction/stopping.  He declines.  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

## 2016-08-28 NOTE — Patient Instructions (Addendum)
Continue to work on the drink reduction.  If you need assistance in this, just let me know You can overlap the omeprazole over the next 3 days, but take the ranitidine only after that. I will have your lab results shortly.      Gastritis, Adult Gastritis is swelling (inflammation) of the stomach. When you have this condition, you can have these problems (symptoms):  Pain in your stomach.  A burning feeling in your stomach.  Feeling sick to your stomach (nauseous).  Throwing up (vomiting).  Feeling too full after you eat.  It is important to get help for this condition. Without help, your stomach can bleed, and you can get sores (ulcers) in your stomach. Follow these instructions at home:  Take over-the-counter and prescription medicines only as told by your doctor.  If you were prescribed an antibiotic medicine, take it as told by your doctor. Do not stop taking it even if you start to feel better.  Drink enough fluid to keep your pee (urine) clear or pale yellow.  Instead of eating big meals, eat small meals often. Contact a health care provider if:  Your problems get worse.  Your problems go away and then come back. Get help right away if:  You throw up blood or something that looks like coffee grounds.  You have black or dark red poop (stools).  You cannot keep fluids down.  Your stomach pain gets worse.  You have a fever.  You do not feel better after 1 week. This information is not intended to replace advice given to you by your health care provider. Make sure you discuss any questions you have with your health care provider. Document Released: 07/12/2007 Document Revised: 09/22/2015 Document Reviewed: 10/17/2014 Elsevier Interactive Patient Education  2018 [employer].     IF you received an x-ray today, you will receive an invoice from [facility] Radiology. Please contact [facility]iology at [phone] with questions or concerns regarding your  invoice.   IF you received labwork today, you will receive an invoice from [facility]. Please contact [facility] at [phone] with questions or concerns regarding your invoice.   Our billing staff will not be able to assist you with questions regarding bills from these companies.  You will be contacted with the lab results as soon as they are available. The fastest way to get your results is to activate your My Chart account. Instructions are located on the last page of this paperwork. If you have not heard from [place] regarding the results in 2 weeks, please contact this office.

## 2016-08-29 LAB — CMP14+EGFR
ALBUMIN: 5.4 g/dL (ref 3.5–5.5)
ALK PHOS: 37 IU/L — AB (ref 39–117)
ALT: 42 IU/L (ref 0–44)
AST: 25 IU/L (ref 0–40)
Albumin/Globulin Ratio: 2.3 — ABNORMAL HIGH (ref 1.2–2.2)
BILIRUBIN TOTAL: 0.5 mg/dL (ref 0.0–1.2)
BUN / CREAT RATIO: 6 — AB (ref 9–20)
BUN: 8 mg/dL (ref 6–20)
CHLORIDE: 97 mmol/L (ref 96–106)
CO2: 23 mmol/L (ref 20–29)
Calcium: 10.1 mg/dL (ref 8.7–10.2)
Creatinine, Ser: 1.25 mg/dL (ref 0.76–1.27)
GFR calc Af Amer: 86 mL/min/{1.73_m2} (ref >59)
GFR calc non Af Amer: 75 mL/min/{1.73_m2} (ref >59)
GLUCOSE: 93 mg/dL (ref 65–99)
Globulin, Total: 2.3 g/dL (ref 1.5–4.5)
Potassium: 4.3 mmol/L (ref 3.5–5.2)
Sodium: 140 mmol/L (ref 134–144)
Total Protein: 7.7 g/dL (ref 6.0–8.5)

## 2016-08-29 LAB — LIPASE: LIPASE: 29 U/L (ref 13–78)

## 2016-11-05 ENCOUNTER — Emergency Department (HOSPITAL_COMMUNITY): Payer: 59

## 2016-11-05 ENCOUNTER — Encounter (HOSPITAL_COMMUNITY): Payer: Self-pay | Admitting: Emergency Medicine

## 2016-11-05 ENCOUNTER — Emergency Department (HOSPITAL_COMMUNITY)
Admission: EM | Admit: 2016-11-05 | Discharge: 2016-11-05 | Disposition: A | Discharge status: Home / Self Care | Payer: 59 | Attending: Emergency Medicine | Admitting: Emergency Medicine

## 2016-11-05 DIAGNOSIS — R079 Chest pain, unspecified: Secondary | ICD-10-CM | POA: Diagnosis present

## 2016-11-05 DIAGNOSIS — Z79899 Other long term (current) drug therapy: Secondary | ICD-10-CM | POA: Diagnosis not present

## 2016-11-05 DIAGNOSIS — F17210 Nicotine dependence, cigarettes, uncomplicated: Secondary | ICD-10-CM | POA: Insufficient documentation

## 2016-11-05 DIAGNOSIS — R0789 Other chest pain: Secondary | ICD-10-CM | POA: Diagnosis not present

## 2016-11-05 DIAGNOSIS — R0602 Shortness of breath: Secondary | ICD-10-CM | POA: Diagnosis not present

## 2016-11-05 DIAGNOSIS — R05 Cough: Secondary | ICD-10-CM | POA: Diagnosis not present

## 2016-11-05 HISTORY — DX: Gastro-esophageal reflux disease without esophagitis: K21.9

## 2016-11-05 LAB — BASIC METABOLIC PANEL
Anion gap: 12 (ref 5–15)
BUN: 6 mg/dL (ref 6–20)
CHLORIDE: 99 mmol/L — AB (ref 101–111)
CO2: 23 mmol/L (ref 22–32)
CREATININE: 1.08 mg/dL (ref 0.61–1.24)
Calcium: 9.7 mg/dL (ref 8.9–10.3)
Glucose, Bld: 105 mg/dL — ABNORMAL HIGH (ref 65–99)
POTASSIUM: 3.8 mmol/L (ref 3.5–5.1)
SODIUM: 134 mmol/L — AB (ref 135–145)

## 2016-11-05 LAB — CBC
HEMATOCRIT: 48.2 % (ref 39.0–52.0)
Hemoglobin: 16.8 g/dL (ref 13.0–17.0)
MCH: 30.1 pg (ref 26.0–34.0)
MCHC: 34.9 g/dL (ref 30.0–36.0)
MCV: 86.4 fL (ref 78.0–100.0)
Platelets: 258 10*3/uL (ref 150–400)
RBC: 5.58 MIL/uL (ref 4.22–5.81)
RDW: 12.6 % (ref 11.5–15.5)
WBC: 5 10*3/uL (ref 4.0–10.5)

## 2016-11-05 LAB — I-STAT TROPONIN, ED: Troponin i, poc: 0 ng/mL (ref 0.00–0.08)

## 2016-11-05 NOTE — ED Triage Notes (Signed)
Woke up at 9am with L sided chest pressure that radiates to L shoulder and L side of neck.  Also c/o sob and mild nausea.  States he has felt sob x 1 week.  Non-productive cough that is no worse than usual.

## 2016-11-05 NOTE — Discharge Instructions (Signed)
Follow up with primary doctor this week for further workup of your chest pain.  If you were given medicines take as directed.  If you are on coumadin or contraceptives realize their levels and effectiveness is altered by many different medicines.  If you have any reaction (rash, tongues swelling, other) to the medicines stop taking and see a physician.    If your blood pressure was elevated in the ER make sure you follow up for management with a primary doctor or return for chest pain, shortness of breath or stroke symptoms.  Please follow up as directed and return to the ER or see a physician for new or worsening symptoms.  Thank you. Vitals:   11/05/16 1521  BP: (!) 157/89  Pulse: 75  Resp: 16  Temp: 98.4 F (36.9 C)  TempSrc: Oral  SpO2: 100%  Weight: 86.2 kg (190 lb)  Height: 6' (1.829 m)

## 2016-11-05 NOTE — ED Notes (Signed)
Pt verbalized understanding discharge instructions and denies any further needs or questions at this time. VS stable, ambulatory and steady gait.   

## 2016-11-05 NOTE — ED Provider Notes (Signed)
[facility] DEPT Provider Note   CSN: [id number] Arrival date & time: 11/05/16  1508     History   Chief Complaint Chief Complaint  Patient presents with  . Chest Pain    HPI Frank Galvan is a 36 y.o. male.  Patient presents with left-sided chest pressure since 91 this morning with mild radiation to left shoulder. Fairly constant. Patient is also had intermittent mild soreness of breath nonproductive cough no worsen usual. Patient has no cardiac or blood clot risk factors. No significant family history. No chest pain or shortness of breath with exercise. No fevers or chills.      Past Medical History:  Diagnosis Date  . Acid reflux     Patient Active Problem List   Diagnosis Date Noted  . Elevated triglycerides with high cholesterol 05/07/2015    History reviewed. No pertinent surgical history.     Home Medications    Prior to Admission medications   Medication Sig Start Date End Date Taking? Authorizing Provider  milk thistle 175 MG tablet Take 175 mg by mouth daily.    [provider]  omeprazole (PRILOSEC) 20 MG capsule Take 1 capsule (20 mg total) by mouth daily. 07/28/15   Wendie Agreste, MD  ondansetron (ZOFRAN-ODT) 8 MG disintegrating tablet Take 1 tablet (8 mg total) by mouth every 8 (eight) hours as needed for nausea. 08/28/16   Ivar Drape D, PA  ranitidine (ZANTAC) 150 MG tablet Take 1 tablet (150 mg total) by mouth 2 (two) times daily. 08/28/16   Joretta Bachelor, PA    Family History Family History  Problem Relation Age of Onset  . Cancer Father   . Stroke Maternal Grandfather     Social History Social History  Substance Use Topics  . Smoking status: Current Every Day Smoker    Packs/day: 0.25    Years: 16.00    Types: Cigarettes  . Smokeless tobacco: Never Used  . Alcohol use 0.0 oz/week     Comment: 6 beers a day     Allergies   Patient has no known allergies.   Review of Systems Review of Systems    Constitutional: Negative for chills and fever.  HENT: Positive for congestion.   Eyes: Negative for visual disturbance.  Respiratory: Positive for cough and shortness of breath.   Cardiovascular: Positive for chest pain. Negative for leg swelling.  Gastrointestinal: Negative for abdominal pain and vomiting.  Genitourinary: Negative for dysuria and flank pain.  Musculoskeletal: Negative for back pain, neck pain and neck stiffness.  Skin: Negative for rash.  Neurological: Negative for light-headedness and headaches.     Physical Exam Updated Vital Signs BP (!) 148/102   Pulse 65   Temp 98.4 F (36.9 C) (Oral)   Resp 17   Ht 6' (1.829 m)   Wt 86.2 kg (190 lb)   SpO2 100%   BMI 25.77 kg/m   Physical Exam  Constitutional: He is oriented to person, place, and time. He appears well-developed and well-nourished.  HENT:  Head: Normocephalic and atraumatic.  Eyes: Conjunctivae are normal. Right eye exhibits no discharge. Left eye exhibits no discharge.  Neck: Normal range of motion. Neck supple. No tracheal deviation present.  Cardiovascular: Normal rate, regular rhythm and intact distal pulses.   Pulmonary/Chest: Effort normal and breath sounds normal.  Abdominal: Soft. He exhibits no distension. There is no tenderness. There is no guarding.  Musculoskeletal: He exhibits no edema or tenderness.  Neurological: He is alert and oriented to  person, place, and time.  Skin: Skin is warm. No rash noted.  Psychiatric: He has a normal mood and affect.  Nursing note and vitals reviewed.    ED Treatments / Results  Labs (all labs ordered are listed, but only abnormal results are displayed) Labs Reviewed  BASIC METABOLIC PANEL - Abnormal; Notable for the following:       Result Value   Sodium 134 (*)    Chloride 99 (*)    Glucose, Bld 105 (*)    All other components within normal limits  CBC  I-STAT TROPONIN, ED    EKG  EKG Interpretation  Date/Time:  Sunday November 05 2016  15:14:46 EDT Ventricular Rate:  73 PR Interval:  156 QRS Duration: 84 QT Interval:  368 QTC Calculation: 405 R Axis:   53 Text Interpretation:  Normal sinus rhythm with sinus arrhythmia Minimal voltage criteria for LVH, may be normal variant Borderline ECG Confirmed by Elnora Morrison [phone]) on 11/05/2016 5:58:28 PM       Radiology Dg Chest 2 View  Result Date: 11/05/2016 CLINICAL DATA:  Acute left chest pain and shortness of breath for 1 week. EXAM: CHEST  2 VIEW COMPARISON:  None. FINDINGS: The cardiomediastinal silhouette is unremarkable. There is no evidence of focal airspace disease, pulmonary edema, suspicious pulmonary nodule/mass, pleural effusion, or pneumothorax. No acute bony abnormalities are identified. IMPRESSION: No active cardiopulmonary disease. Electronically Signed   By: Margarette Canada M.D.   On: 11/05/2016 16:21    Procedures Procedures (including critical care time)  Medications Ordered in ED Medications - No data to display   Initial Impression / Assessment and Plan / ED Course  I have reviewed the triage vital signs and the nursing notes.  Pertinent labs & imaging results that were available during my care of the patient were reviewed by me and considered in my medical decision making (see chart for details).    Patient presents with mild left-sided chest pressure that has since resolved since early this morning. Patient is very low risk cardiac with nonischemic EKG and negative troponin. Discussed outpatient follow-up for possible echo versus stress test versus other workup. Patient is PE RC negative with no blood clot risk factors, no hypoxia and no tachycardia. Patient comfortable close outpatient follow-up with primary care doctor.  Results and differential diagnosis were discussed with the patient/parent/guardian. Xrays were independently reviewed by myself.  Close follow up outpatient was discussed, comfortable with the plan.   Medications - No data to  display  Vitals:   11/05/16 1521 11/05/16 1845  BP: (!) 157/89 (!) 148/102  Pulse: 75 65  Resp: 16 17  Temp: 98.4 F (36.9 C)   TempSrc: Oral   SpO2: 100% 100%  Weight: 86.2 kg (190 lb)   Height: 6' (1.829 m)     Final diagnoses:  Acute chest pain     Final Clinical Impressions(s) / ED Diagnoses   Final diagnoses:  Acute chest pain    New Prescriptions New Prescriptions   No medications on file     Elnora Morrison, MD 11/05/16 1924

## 2017-03-30 ENCOUNTER — Other Ambulatory Visit: Payer: Self-pay

## 2017-03-30 ENCOUNTER — Ambulatory Visit: Payer: Managed Care, Other (non HMO) | Admitting: Physician Assistant

## 2017-03-30 ENCOUNTER — Encounter: Payer: Self-pay | Admitting: Physician Assistant

## 2017-03-30 VITALS — BP 112/80 | HR 61 | Temp 98.2°F | Resp 18 | Ht 72.0 in | Wt 196.6 lb

## 2017-03-30 DIAGNOSIS — L0231 Cutaneous abscess of buttock: Secondary | ICD-10-CM | POA: Diagnosis not present

## 2017-03-30 MED ORDER — DOXYCYCLINE HYCLATE 100 MG PO TABS: 100.0000 mg | ORAL_TABLET | Freq: Two times a day (BID) | ORAL | 0 refills | Status: DC

## 2017-03-30 NOTE — Progress Notes (Signed)
Frank Galvan  MRN: [id number] DOB: 11-08-1980  PCP: Patient, No Pcp Per  Chief Complaint  Patient presents with  . Hemorrhoids    pt thinks him might have a hemorrhoid or an abcess     Subjective:  Pt presents to clinic for swelling and painful areas just to the left of the anus - been there for 4-5 days - no change over the last few days.  No drainage.  No pain with BM.  Pain with walking, sitting and standing.  No fevers/chills or overall feeling bad.  He has had hemorrhoids in the past but this is a little different.  He has used hemorrhoid cream.  History is obtained by patient.  Review of Systems  Constitutional: Negative for chills and fever.  Gastrointestinal: Negative for rectal pain.  Skin: Positive for wound.    Patient Active Problem List   Diagnosis Date Noted  . Elevated triglycerides with high cholesterol 05/07/2015    Current Outpatient Medications on File Prior to Visit  Medication Sig Dispense Refill  . milk thistle 175 MG tablet Take 175 mg by mouth daily.    . ondansetron (ZOFRAN-ODT) 8 MG disintegrating tablet Take 1 tablet (8 mg total) by mouth every 8 (eight) hours as needed for nausea. 21 tablet 0  . ranitidine (ZANTAC) 150 MG tablet Take 1 tablet (150 mg total) by mouth 2 (two) times daily. 60 tablet 1   No current facility-administered medications on file prior to visit.     No Known Allergies  Past Medical History:  Diagnosis Date  . 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 Smoking status: Current Every Day Smoker    Packs/day: 0.12    Years: 16.00    Pack years: 1.92    Types: Cigarettes  . Smokeless tobacco: Never Used  Substance Use Topics  . Alcohol use: Yes    Alcohol/week: 0.0 oz    Comment: 6 beers a day  . Drug use: Yes    Types: Marijuana    Comment: rare social   family history includes Cancer in  his father; Stroke in his maternal grandfather.     Objective:  BP 112/80   Pulse 61   Temp 98.2 F (36.8 C) (Oral)   Resp 18   Ht 6' (1.829 m)   Wt 196 lb 9.6 oz (89.2 kg)   SpO2 98%   BMI 26.66 kg/m  Body mass index is 26.66 kg/m.  Physical Exam  Constitutional: He is oriented to person, place, and time and well-developed, well-nourished, and in no distress.  HENT:  Head: Normocephalic and atraumatic.  Right Ear: External ear normal.  Left Ear: External ear normal.  Eyes: Conjunctivae are normal.  Neck: Normal range of motion.  Pulmonary/Chest: Effort normal.  Genitourinary: Rectal exam shows no external hemorrhoid.  Genitourinary Comments: 1.5cm fluctuent tender area on left inferior aspect of perineum about 2 cm from rectum - no purulence - no pore seen -   Neurological: He is alert and oriented to person, place, and time. Gait normal.  Skin: Skin is warm and dry.  Psychiatric: Mood, memory, affect and judgment normal.   Procedure:  Consent obtained - local anesthesia with 2% lido with epi.  Betadine prep - #11 blade used to make a 1cm incision - purulence expressed - drsg  placed. Assessment and Plan :  Left buttock abscess - Plan: doxycycline (VIBRA-TABS) 100 MG tablet, WOUND CULTURE   Warm compresses - finish all abx  Windell Hummingbird PA-C  [facility] at [place] 03/30/2017 12:27 PM

## 2017-03-30 NOTE — Patient Instructions (Addendum)
  Warm compresses - warm soaks in the bath or with a sock and dry rice in the microwave --   Take all of the antibiotics  After having a BM please be midful to really clean the area well - taking a shower afterwards is a great way to prevent spread of the bacteria    IF you received an x-ray today, you will receive an invoice from [facility]. Please contact [facility] at [phone] with questions or concerns regarding your invoice.   IF you received labwork today, you will receive an invoice from [place]. Please contact [facility] at [phone] with questions or concerns regarding your invoice.   Our billing staff will not be able to assist you with questions regarding bills from these companies.  You will be contacted with the lab results as soon as they are available. The fastest way to get your results is to activate your My Chart account. Instructions are located on the last page of this paperwork. If you have not heard from [place] regarding the results in 2 weeks, please contact this office.

## 2017-04-02 LAB — WOUND CULTURE: ORGANISM ID, BACTERIA: NONE SEEN

## 2017-05-02 ENCOUNTER — Ambulatory Visit (INDEPENDENT_AMBULATORY_CARE_PROVIDER_SITE_OTHER): Payer: Managed Care, Other (non HMO) | Admitting: Physician Assistant

## 2017-05-02 ENCOUNTER — Encounter: Payer: Self-pay | Admitting: Physician Assistant

## 2017-05-02 VITALS — BP 135/81 | HR 65 | Temp 98.5°F | Resp 18 | Ht 72.0 in | Wt 198.0 lb

## 2017-05-02 DIAGNOSIS — Z0000 Encounter for general adult medical examination without abnormal findings: Secondary | ICD-10-CM | POA: Diagnosis not present

## 2017-05-02 DIAGNOSIS — Z1329 Encounter for screening for other suspected endocrine disorder: Secondary | ICD-10-CM

## 2017-05-02 DIAGNOSIS — R0602 Shortness of breath: Secondary | ICD-10-CM | POA: Diagnosis not present

## 2017-05-02 DIAGNOSIS — R0789 Other chest pain: Secondary | ICD-10-CM | POA: Diagnosis not present

## 2017-05-02 DIAGNOSIS — Z13228 Encounter for screening for other metabolic disorders: Secondary | ICD-10-CM

## 2017-05-02 DIAGNOSIS — Z125 Encounter for screening for malignant neoplasm of prostate: Secondary | ICD-10-CM

## 2017-05-02 DIAGNOSIS — Z13220 Encounter for screening for lipoid disorders: Secondary | ICD-10-CM | POA: Diagnosis not present

## 2017-05-02 DIAGNOSIS — Z8042 Family history of malignant neoplasm of prostate: Secondary | ICD-10-CM

## 2017-05-02 DIAGNOSIS — Z130 Encounter for screening for diseases of the blood and blood-forming organs and certain disorders involving the immune mechanism: Secondary | ICD-10-CM | POA: Diagnosis not present

## 2017-05-02 NOTE — Patient Instructions (Addendum)
I am going to refer you to the cardiologist.  They may want to perform a echocardiogram and/or monitor to carry home for a bit.  Please await this contact.  Please limit your etOH to 2 per day.  This can cause the sob and chest pains.  It can affect your electrolytes which can cause arrhythmias or permanent damage.    Keeping you healthy  Get these tests  Blood pressure- Have your blood pressure checked once a year by your healthcare provider.  Normal blood pressure is 120/80.  Weight- Have your body mass index (BMI) calculated to screen for obesity.  BMI is a measure of body fat based on height and weight. You can also calculate your own BMI at [employer]velB[employer].it.  Cholesterol- Have your cholesterol checked regularly starting at age 35, sooner may be necessary if you have diabetes, high blood pressure, if a family member developed heart diseases at an early age or if you smoke.   Chlamydia, HIV, and other sexual transmitted disease- Get screened each year until the age of 35 then within three months of each new sexual partner.  Diabetes- Have your blood sugar checked regularly if you have high blood pressure, high cholesterol, a family history of diabetes or if you are overweight.  Get these vaccines  Flu shot- Every fall.  Tetanus shot- Every 10 years.  Menactra- Single dose; prevents meningitis.  Take these steps  Don't smoke- If you do smoke, ask your healthcare provider about quitting. For tips on how to quit, go to www.smokefree.gov or call 1-800-QUIT-NOW.  Be physically active- Exercise 5 days a week for at least 30 minutes.  If you are not already physically active start slow and gradually work up to 30 minutes of moderate physical activity.  Examples of moderate activity include walking briskly, mowing the yard, dancing, swimming bicycling, etc.  Eat a healthy diet- Eat a variety of healthy foods such as fruits, vegetables, low fat milk, low fat cheese, yogurt, lean  meats, poultry, fish, beans, tofu, etc.  For more information on healthy eating, go to www.thenutritionsource.org  Drink alcohol in moderation- Limit alcohol intake two drinks or less a day.  Never drink and drive.  Dentist- Brush and floss teeth twice daily; visit your dentis twice a year.  Depression-Your emotional health is as important as your physical health.  If you're feeling down, losing interest in things you normally enjoy please talk with your healthcare provider.  Gun Safety- If you keep a gun in your home, keep it unloaded and with the safety lock on.  Bullets should be stored separately.  Helmet use- Always wear a helmet when riding a motorcycle, bicycle, rollerblading or skateboarding.  Safe sex- If you may be exposed to a sexually transmitted infection, use a condom  Seat belts- Seat bels can save your life; always wear one.  Smoke/Carbon Monoxide detectors- These detectors need to be installed on the appropriate level of your home.  Replace batteries at least once a year.  Skin Cancer- When out in the sun, cover up and use sunscreen SPF 15 or higher.  Violence- If anyone is threatening or hurting you, please tell your healthcare provider.   IF you received an x-ray today, you will receive an invoice from [facility]. Please contact [facility] at [phone] with questions or concerns regarding your invoice.   IF you received labwork today, you will receive an invoice from [place]. Please contact [facility] at [phone] with questions or concerns regarding your invoice.  Our billing staff will not be able to assist you with questions regarding bills from these companies.  You will be contacted with the lab results as soon as they are available. The fastest way to get your results is to activate your My Chart account. Instructions are located on the last page of this paperwork. If you have not heard from [place] regarding the results in 2 weeks, please  contact this office.

## 2017-05-02 NOTE — Progress Notes (Signed)
PRIMARY CARE AT [facility] [address] [place] [address] [phone]  Date:  05/02/2017   Name:  Frank Galvan   DOB:  [birth year]   MRN:  [id number]  PCP:  Mancel Bale, PA-C    History of Present Illness:  Frank Galvan is a 37 y.o. male patient who presents to PCP with  Chief Complaint  Patient presents with  . Annual Exam     DIET: he eats everything, chicken and steaks.  He eats fried foods as well.  Vegetables are not every day.  Water intake >48.  Caffeine: 4 cups of coffee  URINATION: normal   BM: no constipation or diarrhea.  No black or bloody stool.  SLEEP: "alright", 5-6 hours per night.    SOCIAL ACTIVITY: not a lot lately.  He golfs.   Exercise: tries to walk 4-5 times per week 1 mile.   EtOH: 4 per day, more on weekends.  Cut back since last year.  Prior consumption 9 per day. Tobacco or vaping: both cigarettes cut back to 3 per day Sexual active: yes.  Some times unprotected.    Through ROS, patient does state he has had intermittent bouts of shortness of breath, there are some palpation like symptoms associated.  No nausea or diaphoresis.  No cardiac family history.    Patient Active Problem List   Diagnosis Date Noted  . Elevated triglycerides with high cholesterol 05/07/2015    Past Medical History:  Diagnosis Date  . Acid reflux     No past surgical history on file.  Social History   Tobacco Use  . Smoking status: Current Every Day Smoker    Packs/day: 0.12    Years: 16.00    Pack years: 1.92    Types: Cigarettes  . Smokeless tobacco: Never Used  Substance Use Topics  . Alcohol use: Yes    Alcohol/week: 0.0 oz    Comment: 6 beers a day  . Drug use: Yes    Types: Marijuana    Comment: rare social    Family History  Problem Relation Age of Onset  . Hypertension Mother   . Cancer Father   . Stroke Maternal Grandfather     No Known Allergies  Medication list has been reviewed and updated.  Current Outpatient Medications on  File Prior to Visit  Medication Sig Dispense Refill  . ranitidine (ZANTAC) 150 MG tablet Take 1 tablet (150 mg total) by mouth 2 (two) times daily. 60 tablet 1   No current facility-administered medications on file prior to visit.     ROS ROS otherwise unremarkable unless listed above.  Physical Examination: BP 135/81   Pulse 65   Temp 98.5 F (36.9 C) (Oral)   Resp 18   Ht 6' (1.829 m)   Wt 198 lb (89.8 kg)   SpO2 99%   BMI 26.85 kg/m  Ideal Body Weight: Weight in (lb) to have BMI = 25: 183.9  Physical Exam  Constitutional: He is oriented to person, place, and time. He appears well-developed and well-nourished. No distress.  HENT:  Head: Normocephalic and atraumatic.  Right Ear: Tympanic membrane, external ear and ear canal normal.  Left Ear: Tympanic membrane, external ear and ear canal normal.  Eyes: Pupils are equal, round, and reactive to light. Conjunctivae and EOM are normal.  Cardiovascular: Normal rate and regular rhythm. Exam reveals no friction rub.  No murmur heard. Pulmonary/Chest: Effort normal. No respiratory distress. He has no wheezes.  Abdominal: Soft. Bowel sounds  are normal. He exhibits no distension and no mass. There is no tenderness.  Musculoskeletal: Normal range of motion. He exhibits no edema or tenderness.  Neurological: He is alert and oriented to person, place, and time. He displays normal reflexes.  Skin: Skin is warm and dry. He is not diaphoretic.  Psychiatric: He has a normal mood and affect. His behavior is normal.     Assessment and Plan: Frank Galvan is a 37 y.o. male who is here today for cc of  Chief Complaint  Patient presents with  . Annual Exam   Annual physical exam - Plan: CBC, CMP14+EGFR, TSH, Lipid panel, EKG 12-Lead, PSA  Screening for deficiency anemia - Plan: CBC  Screening for metabolic disorder - Plan: CMP14+EGFR  Screening for thyroid disorder - Plan: TSH  Screening for lipid disorders - Plan: Lipid panel  SOB  (shortness of breath) - Plan: EKG 12-Lead, Ambulatory referral to Cardiology  Chest discomfort - Plan: EKG 12-Lead, Ambulatory referral to Cardiology  Screening for prostate cancer - Plan: PSA  Family history of prostate cancer in father - Plan: PSA  Ivar Drape, PA-C [facility] 3/29/20192:13 PM

## 2017-05-03 LAB — CMP14+EGFR
A/G RATIO: 2.1 (ref 1.2–2.2)
ALBUMIN: 5.1 g/dL (ref 3.5–5.5)
ALT: 38 IU/L (ref 0–44)
AST: 25 IU/L (ref 0–40)
Alkaline Phosphatase: 33 IU/L — ABNORMAL LOW (ref 39–117)
BILIRUBIN TOTAL: 0.4 mg/dL (ref 0.0–1.2)
BUN / CREAT RATIO: 13 (ref 9–20)
BUN: 13 mg/dL (ref 6–20)
CHLORIDE: 101 mmol/L (ref 96–106)
CO2: 22 mmol/L (ref 20–29)
Calcium: 9.7 mg/dL (ref 8.7–10.2)
Creatinine, Ser: 1.03 mg/dL (ref 0.76–1.27)
GFR calc non Af Amer: 94 mL/min/{1.73_m2} (ref >59)
GFR, EST AFRICAN AMERICAN: 108 mL/min/{1.73_m2} (ref >59)
GLUCOSE: 94 mg/dL (ref 65–99)
Globulin, Total: 2.4 g/dL (ref 1.5–4.5)
POTASSIUM: 4.3 mmol/L (ref 3.5–5.2)
SODIUM: 141 mmol/L (ref 134–144)
TOTAL PROTEIN: 7.5 g/dL (ref 6.0–8.5)

## 2017-05-03 LAB — LIPID PANEL
CHOL/HDL RATIO: 4.1 ratio (ref 0.0–5.0)
Cholesterol, Total: 234 mg/dL — ABNORMAL HIGH (ref 100–199)
HDL: 57 mg/dL (ref >39)
LDL Calculated: 153 mg/dL — ABNORMAL HIGH (ref 0–99)
Triglycerides: 119 mg/dL (ref 0–149)
VLDL Cholesterol Cal: 24 mg/dL (ref 5–40)

## 2017-05-03 LAB — CBC
Hematocrit: 45.1 % (ref 37.5–51.0)
Hemoglobin: 15.4 g/dL (ref 13.0–17.7)
MCH: 29.9 pg (ref 26.6–33.0)
MCHC: 34.1 g/dL (ref 31.5–35.7)
MCV: 88 fL (ref 79–97)
Platelets: 273 10*3/uL (ref 150–379)
RBC: 5.15 x10E6/uL (ref 4.14–5.80)
RDW: 13.5 % (ref 12.3–15.4)
WBC: 3.4 10*3/uL (ref 3.4–10.8)

## 2017-05-03 LAB — PSA: Prostate Specific Ag, Serum: 0.6 ng/mL (ref 0.0–4.0)

## 2017-05-03 LAB — TSH: TSH: 2.02 u[IU]/mL (ref 0.450–4.500)

## 2017-05-16 ENCOUNTER — Encounter: Payer: Self-pay | Admitting: Physician Assistant

## 2017-09-12 ENCOUNTER — Encounter: Payer: Self-pay | Admitting: Physician Assistant

## 2017-09-12 ENCOUNTER — Ambulatory Visit: Payer: Managed Care, Other (non HMO) | Admitting: Physician Assistant

## 2017-09-12 VITALS — BP 143/85 | HR 54 | Temp 98.3°F | Resp 16 | Ht 72.0 in | Wt 197.6 lb

## 2017-09-12 DIAGNOSIS — W57XXXA Bitten or stung by nonvenomous insect and other nonvenomous arthropods, initial encounter: Secondary | ICD-10-CM | POA: Diagnosis not present

## 2017-09-12 DIAGNOSIS — S90562A Insect bite (nonvenomous), left ankle, initial encounter: Secondary | ICD-10-CM | POA: Diagnosis not present

## 2017-09-12 NOTE — Patient Instructions (Addendum)
I recommend a few days of Benadryl for your ankle.   See below for signs/symptoms of Lymes. If you are concerned in 2-3 weeks, come back in for a LAB ONLY visit to have your blood drawn.  Lyme Disease Lyme disease is an infection that affects many parts of the body, including the skin, joints, and nervous system. It is a bacterial infection that starts from the bite of an infected tick. The infection can spread, and some of the symptoms are similar to the flu. If Lyme disease is not treated, it may cause joint pain, swelling, numbness, problems thinking, fatigue, muscle weakness, and other problems. What are the causes? This condition is caused by bacteria called Borrelia burgdorferi. You can get Lyme disease by being bitten by an infected tick. The tick must be attached to your skin to pass along the infection. Deer often carry infected ticks. What increases the risk? The following factors may make you more likely to develop this condition:  Living in or visiting these areas in the [place] ? [place]. ? [place]. ? [place].  Spending time in wooded or grassy areas.  Being outdoors with exposed skin.  Camping, gardening, hiking, fishing, or hunting outdoors.  Failing to remove a tick from your skin within 3-4 days.  What are the signs or symptoms? Symptoms of this condition include:  A round, red rash that surrounds the center of the tick bite. This is the first sign of infection. The center of the rash may be blood colored or have tiny blisters.  Fatigue.  Headache.  Chills and fever.  General achiness.  Joint pain, often in the knees.  Muscle pain.  Swollen lymph glands.  Stiff neck.  How is this diagnosed? This condition is diagnosed based on:  Your symptoms and medical history.  A physical exam.  A blood test.  How is this treated? The main treatment for this condition is antibiotic medicine, which is usually taken by mouth  (orally). The length of treatment depends on how soon after a tick bite you begin taking the medicine. In some cases, treatment is necessary for several weeks. If the infection is severe, antibiotics may need to be given through an IV tube that is inserted into one of your veins. Follow these instructions at home:  Take your antibiotic medicine as told by your health care provider. Do not stop taking the antibiotic even if you start to feel better.  Ask your health care provider about takinga probiotic in between doses of your antibiotic to help avoid stomach upset or diarrhea.  Check with your health care provider before supplementing your treatment. Many alternative therapies have not been proven and may be harmful to you.  Keep all follow-up visits as told by your health care provider. This is important. How is this prevented? You can become reinfected if you get another tick bite from an infected tick. Take these steps to help prevent an infection:  Cover your skin with light-colored clothing when you are outdoors in the spring and summer months.  Spray clothing and skin with bug spray. The spray should be 20-30% DEET.  Avoid wooded, grassy, and shaded areas.  Remove yard litter, brush, trash, and plants that attract deer and rodents.  Check yourself for ticks when you come indoors.  Wash clothing worn each day.  Check your pets for ticks before they come inside.  If you find a tick: ? Remove it with tweezers. ? Clean your hands  and the bite area with rubbing alcohol or soap and water.  Pregnant women should take special care to avoid tick bites because the infection can be passed along to the fetus. Contact a health care provider if:  You have symptoms after treatment.  You have removed a tick and want to bring it to your health care provider for testing. Get help right away if:  You have an irregular heartbeat.  You have nerve pain.  Your face feels numb. This  information is not intended to replace advice given to you by your health care provider. Make sure you discuss any questions you have with your health care provider. Document Released: 05/01/2000 Document Revised: 09/14/2015 Document Reviewed: 09/14/2015 Elsevier Interactive Patient Education  2018 [employer].   IF you received an x-ray today, you will receive an invoice from [facility]. Please contact [facility]iology at [phone] with questions or concerns regarding your invoice.   IF you received labwork today, you will receive an invoice from [place] [facility]. Please contact [facility] at [phone] with questions or concerns regarding your invoice.   Our billing staff will not be able to assist you with questions regarding bills from these companies.  You will be contacted with the lab results as soon as they are available. The fastest way to get your results is to activate your My Chart account. Instructions are located on the last page of this paperwork. If you have not heard from [place] regarding the results in 2 weeks, please contact this office.

## 2017-09-12 NOTE — Progress Notes (Signed)
   Frank Galvan  MRN: [id number] DOB: 04-11-1980  PCP: Mancel Bale, PA-C  Subjective:  Pt is a pleasant 37 year old male who presents to clinic for concern for Lyme's disease after receiving a possible bug bite on his left ankle x 5 days.   Pt went to [place] last Thursday, played golf with short socks Friday morning. Noticed the bite Saturday morning. Did not see a tick, did not see a bug. He endorses receiving multiple insect bites over the course of the weekend, however one on the outside of his left ankle left a bruise and swelling extending to the top of his foot. Swelling and bruise has almost completely resolved as of this morning. Endorses discomfort of this left knee.   He is concerned as one of his friends was recently diagnosed with Lyme's disease.   Review of Systems  Constitutional: Negative for chills, diaphoresis, fatigue and fever.  Respiratory: Negative for cough, chest tightness and shortness of breath.   Cardiovascular: Negative for chest pain and palpitations.  Gastrointestinal: Negative for diarrhea, nausea and vomiting.  Musculoskeletal: Positive for arthralgias and joint swelling (left ankle). Negative for gait problem, neck pain and neck stiffness.  Skin: Positive for rash and wound.  Neurological: Negative for dizziness, light-headedness and headaches.    Patient Active Problem List   Diagnosis Date Noted  . Elevated triglycerides with high cholesterol 05/07/2015    Current Outpatient Medications on File Prior to Visit  Medication Sig Dispense Refill  . ranitidine (ZANTAC) 150 MG tablet Take 1 tablet (150 mg total) by mouth 2 (two) times daily. 60 tablet 1   No current facility-administered medications on file prior to visit.     No Known Allergies   Objective:  BP (!) 143/85 (BP Location: Right Arm, Patient Position: Sitting, Cuff Size: Normal)   Pulse (!) 54   Temp 98.3 F (36.8 C) (Oral)   Resp 16   Ht 6' (1.829 m)   Wt 197 lb 9.6 oz (89.6  kg)   SpO2 99%   BMI 26.80 kg/m   Physical Exam  Constitutional: He is oriented to person, place, and time. He appears well-developed and well-nourished.  Cardiovascular: Normal rate and regular rhythm.  Musculoskeletal:       Left ankle: He exhibits normal range of motion and no swelling. No tenderness.  Neurological: He is alert and oriented to person, place, and time.  Skin: Skin is warm and dry. No rash noted.     Psychiatric: He has a normal mood and affect. His behavior is normal. Judgment and thought content normal.  Vitals reviewed.   Assessment and Plan :  1. Bug bite, initial encounter -Patient presents concerned about Lyme's disease after possible bug bite.  He indeed was in an endemic area however considering timeline if he was bitten by a tick contact would have been less than 24 hours and he is asymptomatic with no rash.  Will put in future orders in case he develops symptoms.  Advised patient to return to clinic if symptoms worsen.  I do not feel like he needs treatment today. - Lyme Ab/Western Blot Reflex; Future  Mercer Pod, PA-C  [facility] 09/12/2017 12:07 PM  Please note: Portions of this report may have been transcribed using dragon voice recognition software. Every effort was made to ensure accuracy; however, inadvertent computerized transcription errors may be present.

## 2018-02-21 IMAGING — CR DG CHEST 2V
2 series · 2 of 2 positions shown · non-contrast
Comparison: None.

CLINICAL DATA: Acute left chest pain and shortness of breath for 1
week.

EXAM:
CHEST  2 VIEW

[chest pa]
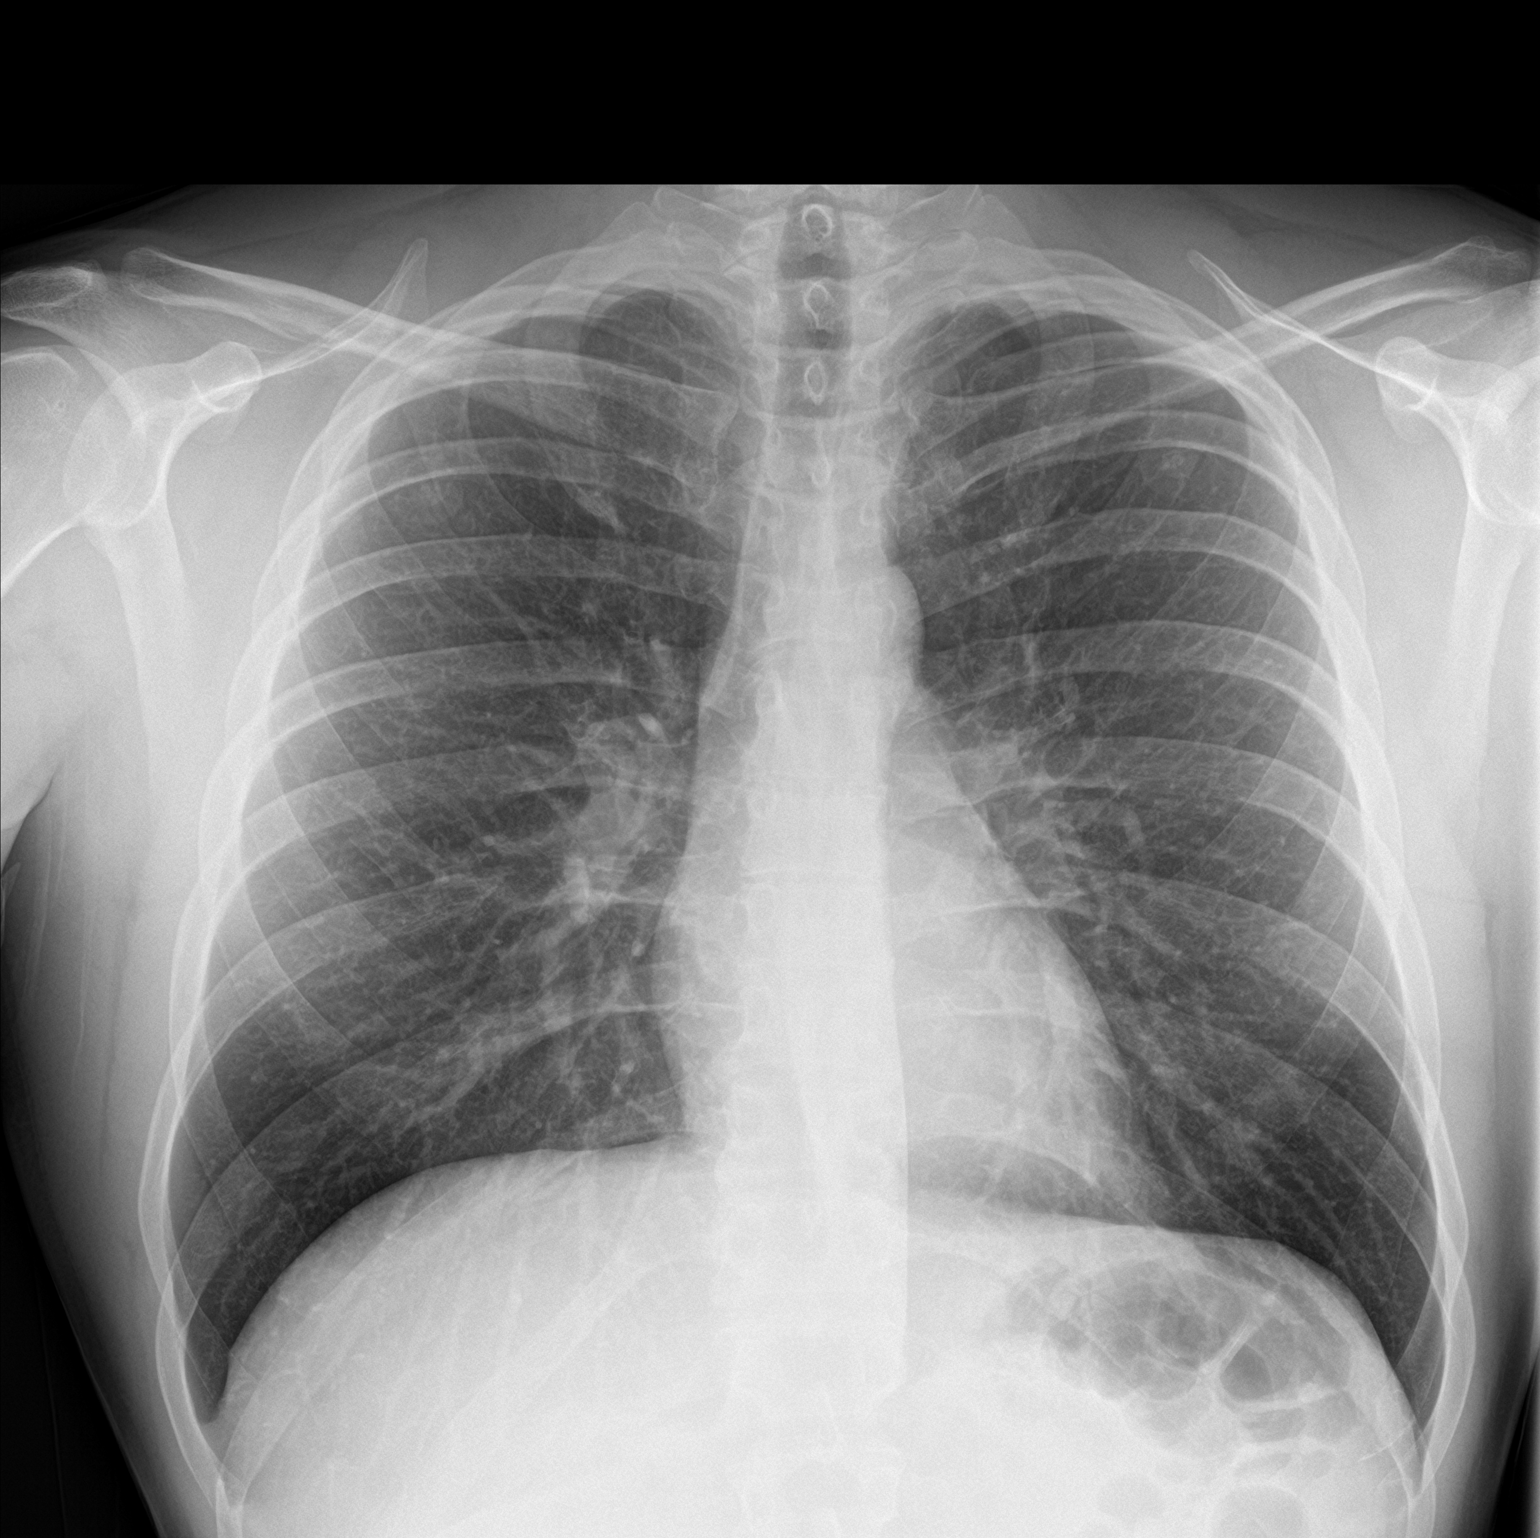

[chest lat]
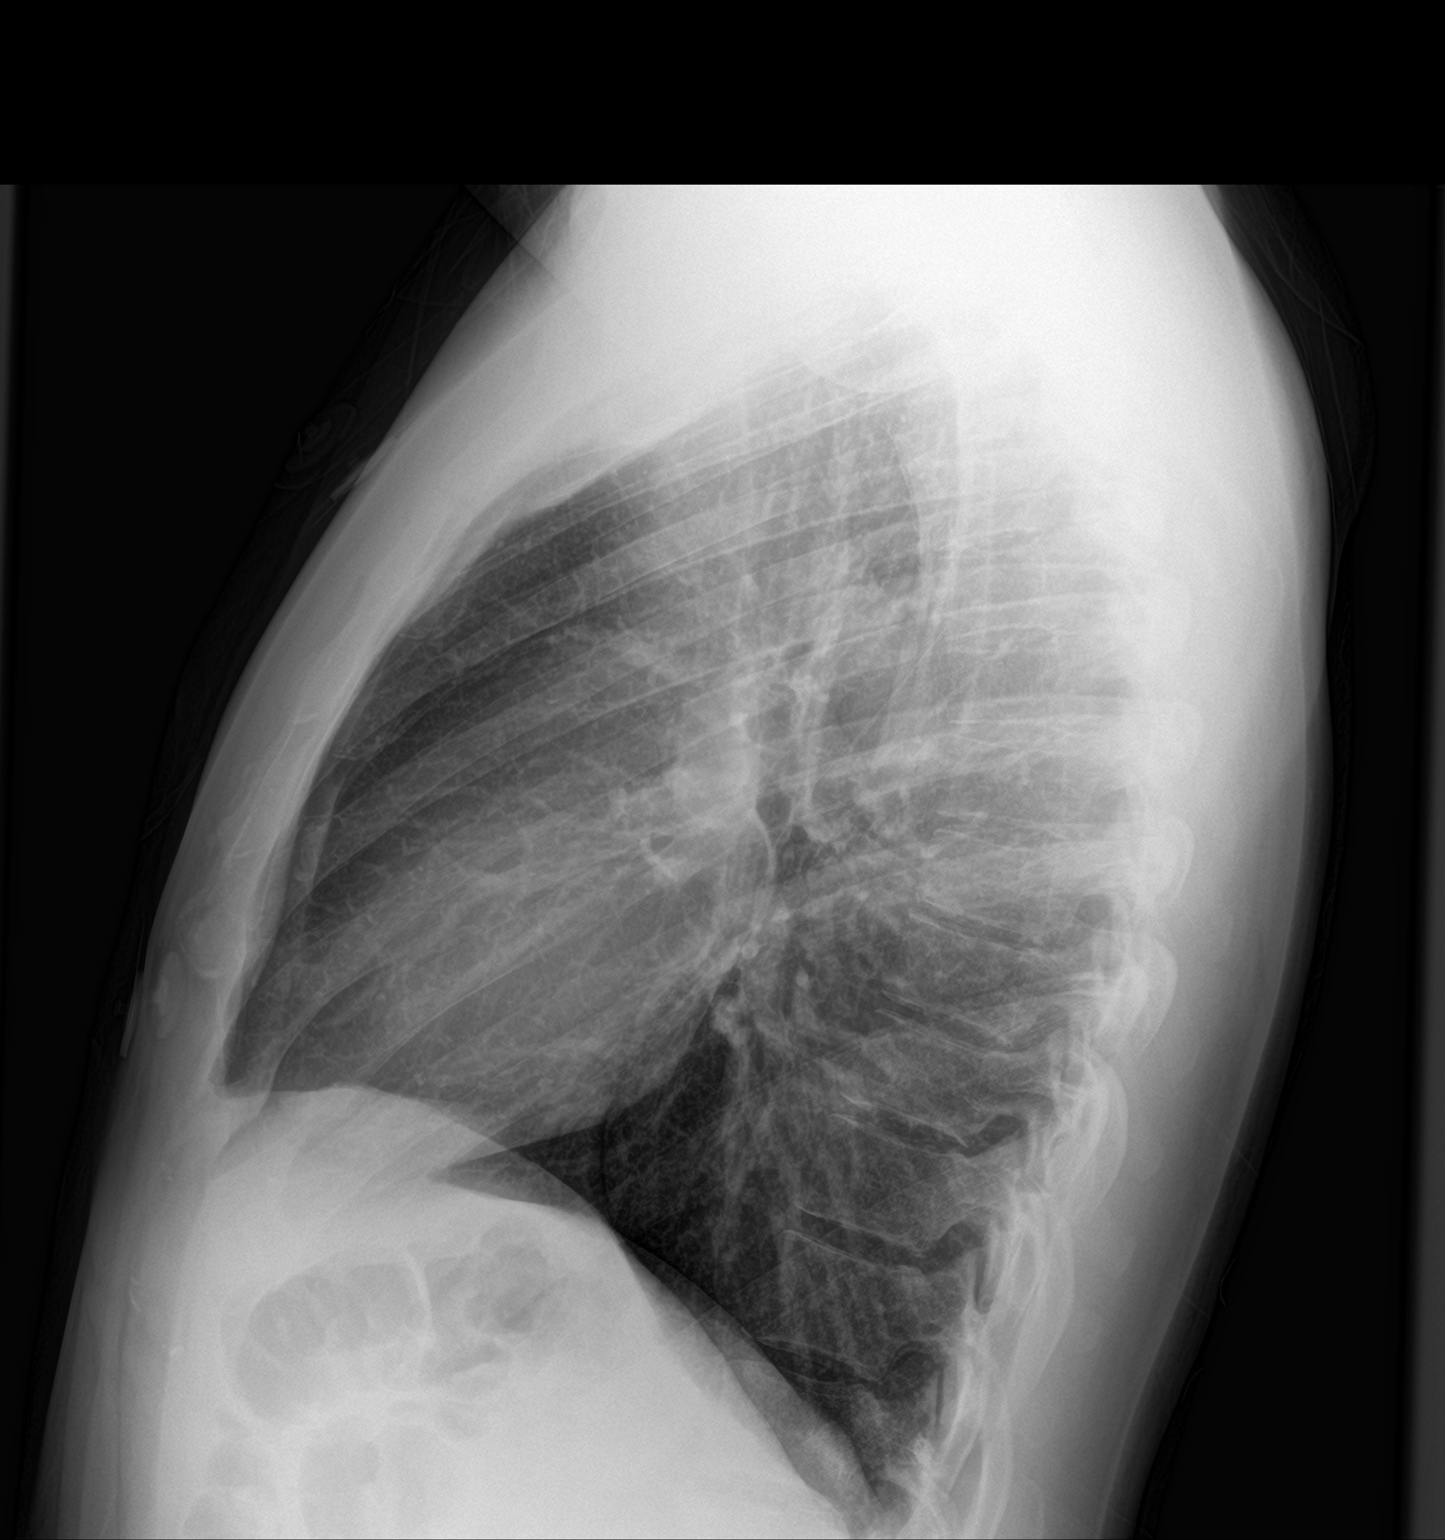

[2 of 2 positions shown; findings below may reference images not displayed]

FINDINGS: The cardiomediastinal silhouette is unremarkable.

There is no evidence of focal airspace disease, pulmonary edema,
suspicious pulmonary nodule/mass, pleural effusion, or pneumothorax.
No acute bony abnormalities are identified.
IMPRESSION: No active cardiopulmonary disease.

## 2018-03-09 ENCOUNTER — Other Ambulatory Visit: Payer: Self-pay

## 2018-03-09 ENCOUNTER — Ambulatory Visit: Payer: Managed Care, Other (non HMO) | Admitting: Emergency Medicine

## 2018-03-09 ENCOUNTER — Encounter: Payer: Self-pay | Admitting: Emergency Medicine

## 2018-03-09 VITALS — BP 144/93 | HR 88 | Temp 98.4°F | Resp 17 | Ht 72.0 in | Wt 192.6 lb

## 2018-03-09 DIAGNOSIS — K610 Anal abscess: Secondary | ICD-10-CM

## 2018-03-09 DIAGNOSIS — K6289 Other specified diseases of anus and rectum: Secondary | ICD-10-CM | POA: Diagnosis not present

## 2018-03-09 DIAGNOSIS — Z0189 Encounter for other specified special examinations: Secondary | ICD-10-CM | POA: Diagnosis not present

## 2018-03-09 DIAGNOSIS — Z7689 Persons encountering health services in other specified circumstances: Secondary | ICD-10-CM

## 2018-03-09 MED ORDER — CEPHALEXIN 500 MG PO CAPS: 500.0000 mg | ORAL_CAPSULE | Freq: Three times a day (TID) | ORAL | 0 refills | Status: AC

## 2018-03-09 NOTE — Patient Instructions (Addendum)
   If you have lab work done today you will be contacted with your lab results within the next 2 weeks.  If you have not heard from us then please contact us. The fastest way to get your results is to register for My Chart.   IF you received an x-ray today, you will receive an invoice from [facility]. Please contact [facility] at [phone] with questions or concerns regarding your invoice.   IF you received labwork today, you will receive an invoice from [facility]. Please contact [facility] at [phone] with questions or concerns regarding your invoice.   Our billing staff will not be able to assist you with questions regarding bills from these companies.  You will be contacted with the lab results as soon as they are available. The fastest way to get your results is to activate your My Chart account. Instructions are located on the last page of this paperwork. If you have not heard from us regarding the results in 2 weeks, please contact this office.     Incision and Drainage, Care After Refer to this sheet in the next few weeks. These instructions provide you with information about caring for yourself after your procedure. Your health care provider may also give you more specific instructions. Your treatment has been planned according to current medical practices, but problems sometimes occur. Call your health care provider if you have any problems or questions after your procedure. What can I expect after the procedure? After the procedure, it is common to have:  Pain or discomfort around your incision site.  Drainage from your incision. Follow these instructions at home:  Take over-the-counter and prescription medicines only as told by your health care provider.  If you were prescribed an antibiotic medicine, take it as told by your health care provider.Do not stop taking the antibiotic even if you start to feel better.  Followinstructions from your health  care provider about: ? How to take care of your incision. ? When and how you should change your packing and bandage (dressing). Wash your hands with soap and water before you change your dressing. If soap and water are not available, use hand sanitizer. ? When you should remove your dressing.  Do not take baths, swim, or use a hot tub until your health care provider approves.  Keep all follow-up visits as told by your health care provider. This is important.  Check your incision area every day for signs of infection. Check for: ? More redness, swelling, or pain. ? More fluid or blood. ? Warmth. ? Pus or a bad smell. Contact a health care provider if:  Your cyst or abscess returns.  You have a fever.  You have more redness, swelling, or pain around your incision.  You have more fluid or blood coming from your incision.  Your incision feels warm to the touch.  You have pus or a bad smell coming from your incision. Get help right away if:  You have severe pain or bleeding.  You cannot eat or drink without vomiting.  You have decreased urine output.  You become short of breath.  You have chest pain.  You cough up blood.  The area where the incision and drainage occurred becomes numb or it tingles. This information is not intended to replace advice given to you by your health care provider. Make sure you discuss any questions you have with your health care provider. Document Released: 04/17/2011 Document Revised: 06/25/2015 Document Reviewed: 11/13/2014   Elsevier Interactive Patient Education  2019 Elsevier Inc.  

## 2018-03-09 NOTE — Progress Notes (Signed)
Frank Galvan 38 y.o.   Chief Complaint  Patient presents with  . peri anal abscess x this week, per pt had this a year ago an    pain level 5/10, ibuprofen for pain with little relief.  Per pt getting larger    HISTORY OF PRESENT ILLNESS: This is a 38 y.o. male complaining of painful lump in the anal area for a week.  Has a history of perianal abscesses drained in the past.  Feels same way.  HPI   Prior to Admission medications   Medication Sig Start Date End Date Taking? Authorizing Provider  ranitidine (ZANTAC) 150 MG tablet Take 1 tablet (150 mg total) by mouth 2 (two) times daily. Patient not taking: Reported on 03/09/2018 08/28/16   Ivar Drape D, PA    No Known Allergies  Patient Active Problem List   Diagnosis Date Noted  . Elevated triglycerides with high cholesterol 05/07/2015    Past Medical History:  Diagnosis Date  . Acid reflux     No past surgical history on file.  Social History   Socioeconomic History  . Marital status: Single    Spouse name: Not on file  . Number of children: Not on file  . Years of education: Not on file  . Highest education level: Not on file  Occupational History  . Not on file  Social Needs  . Financial resource strain: Not on file  . Food insecurity:    Worry: Not on file    Inability: Not on file  . Transportation needs:    Medical: Not on file    Non-medical: Not on file  Tobacco Use  . Smoking status: Current Every Day Smoker    Packs/day: 0.12    Years: 16.00    Pack years: 1.92    Types: Cigarettes  . Smokeless tobacco: Never Used  Substance and Sexual Activity  . Alcohol use: Yes    Alcohol/week: 0.0 standard drinks    Comment: 6 beers a day  . Drug use: Yes    Types: Marijuana    Comment: rare social  . Sexual activity: Yes    Partners: Female  Lifestyle  . Physical activity:    Days per week: Not on file    Minutes per session: Not on file  . Stress: Not on file  Relationships  . Social  connections:    Talks on phone: Not on file    Gets together: Not on file    Attends religious service: Not on file    Active member of club or organization: Not on file    Attends meetings of clubs or organizations: Not on file    Relationship status: Not on file  . Intimate partner violence:    Fear of current or ex partner: Not on file    Emotionally abused: Not on file    Physically abused: Not on file    Forced sexual activity: Not on file  Other Topics Concern  . 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 Hypertension Mother   . Cancer Father   . Stroke Maternal Grandfather      Review of Systems  Constitutional: Negative.  Negative for chills and fever.  Respiratory: Negative for shortness of breath.   Cardiovascular: Negative for chest pain.  Gastrointestinal: Negative for diarrhea, nausea and  vomiting.  Neurological: Negative for dizziness and headaches.    Vitals:   03/09/18 1011  BP: (!) 144/93  Pulse: 88  Resp: 17  Temp: 98.4 F (36.9 C)  SpO2: 97%    Physical Exam Vitals signs reviewed.  Constitutional:      Appearance: Normal appearance.  HENT:     Head: Normocephalic and atraumatic.     Mouth/Throat:     Mouth: Mucous membranes are moist.     Pharynx: Oropharynx is clear.  Eyes:     Extraocular Movements: Extraocular movements intact.     Conjunctiva/sclera: Conjunctivae normal.     Pupils: Pupils are equal, round, and reactive to light.  Cardiovascular:     Rate and Rhythm: Normal rate and regular rhythm.  Pulmonary:     Effort: Pulmonary effort is normal.     Breath sounds: Normal breath sounds.  Genitourinary:   Skin:    General: Skin is warm and dry.  Neurological:     General: No focal deficit present.     Mental Status: He is alert and oriented to person, place, and time.  Psychiatric:        Mood and Affect:  Mood normal.        Behavior: Behavior normal.    Procedure note: Area cleansed with Betadine.  Infiltrated with lidocaine with epi.  Incision made with #11 disposable blade and moderate amount of purulent material drained.  Area probed and packed with half-inch plain gauze.  Covered with 4 x 4 gauze.  Patient tolerated procedure well.  No complications.  ASSESSMENT & PLAN: Khizar was seen today for peri anal abscess x this week, per pt had this a year ago an.  Diagnoses and all orders for this visit:  Perianal abscess -     cephALEXin (KEFLEX) 500 MG capsule; Take 1 capsule (500 mg total) by mouth 3 (three) times daily for 7 days.  Anal pain  Encounter for incision and drainage procedure    Patient Instructions       If you have lab work done today you will be contacted with your lab results within the next 2 weeks.  If you have not heard from [place] then please contact us. The fastest way to get your results is to register for My Chart.   IF you received an x-ray today, you will receive an invoice from [facility]. Please contact [facility] at [phone] with questions or concerns regarding your invoice.   IF you received labwork today, you will receive an invoice from [place]. Please contact [facility] at [phone] with questions or concerns regarding your invoice.   Our billing staff will not be able to assist you with questions regarding bills from these companies.  You will be contacted with the lab results as soon as they are available. The fastest way to get your results is to activate your My Chart account. Instructions are located on the last page of this paperwork. If you have not heard from [place] regarding the results in 2 weeks, please contact this office.     Incision and Drainage, Care After Refer to this sheet in the next few weeks. These instructions provide you with information about caring for yourself after your procedure. Your health care  provider may also give you more specific instructions. Your treatment has been planned according to current medical practices, but problems sometimes occur. Call your health care provider if you have any problems or questions after your procedure. What can I expect  after the procedure? After the procedure, it is common to have:  Pain or discomfort around your incision site.  Drainage from your incision. Follow these instructions at home:  Take over-the-counter and prescription medicines only as told by your health care provider.  If you were prescribed an antibiotic medicine, take it as told by your health care provider.Do not stop taking the antibiotic even if you start to feel better.  Followinstructions from your health care provider about: ? How to take care of your incision. ? When and how you should change your packing and bandage (dressing). Wash your hands with soap and water before you change your dressing. If soap and water are not available, use hand sanitizer. ? When you should remove your dressing.  Do not take baths, swim, or use a hot tub until your health care provider approves.  Keep all follow-up visits as told by your health care provider. This is important.  Check your incision area every day for signs of infection. Check for: ? More redness, swelling, or pain. ? More fluid or blood. ? Warmth. ? Pus or a bad smell. Contact a health care provider if:  Your cyst or abscess returns.  You have a fever.  You have more redness, swelling, or pain around your incision.  You have more fluid or blood coming from your incision.  Your incision feels warm to the touch.  You have pus or a bad smell coming from your incision. Get help right away if:  You have severe pain or bleeding.  You cannot eat or drink without vomiting.  You have decreased urine output.  You become short of breath.  You have chest pain.  You cough up blood.  The area where the  incision and drainage occurred becomes numb or it tingles. This information is not intended to replace advice given to you by your health care provider. Make sure you discuss any questions you have with your health care provider. Document Released: 04/17/2011 Document Revised: 06/25/2015 Document Reviewed: 11/13/2014 Elsevier Interactive Patient Education  2019 Elsevier Inc.      Agustina Caroli, MD [facility]

## 2018-03-10 ENCOUNTER — Encounter: Payer: Self-pay | Admitting: Emergency Medicine

## 2018-03-10 DIAGNOSIS — K610 Anal abscess: Secondary | ICD-10-CM | POA: Insufficient documentation

## 2018-03-10 DIAGNOSIS — Z7689 Persons encountering health services in other specified circumstances: Secondary | ICD-10-CM | POA: Insufficient documentation

## 2018-03-10 DIAGNOSIS — K6289 Other specified diseases of anus and rectum: Secondary | ICD-10-CM | POA: Insufficient documentation

## 2018-03-10 DIAGNOSIS — Z0189 Encounter for other specified special examinations: Secondary | ICD-10-CM

## 2018-03-11 ENCOUNTER — Ambulatory Visit: Payer: Managed Care, Other (non HMO) | Admitting: Emergency Medicine

## 2018-03-11 ENCOUNTER — Other Ambulatory Visit: Payer: Self-pay

## 2018-03-11 ENCOUNTER — Encounter: Payer: Self-pay | Admitting: Emergency Medicine

## 2018-03-11 VITALS — BP 119/82 | HR 72 | Temp 98.5°F | Resp 16 | Ht 72.5 in | Wt 188.2 lb

## 2018-03-11 DIAGNOSIS — K610 Anal abscess: Secondary | ICD-10-CM

## 2018-03-11 DIAGNOSIS — K6289 Other specified diseases of anus and rectum: Secondary | ICD-10-CM

## 2018-03-11 NOTE — Progress Notes (Signed)
Frank Galvan 38 y.o.   Chief Complaint  Patient presents with  . perianal abcess    follow up    HISTORY OF PRESENT ILLNESS: This is a 38 y.o. male seen by me 2 days ago with a perianal abscess, status post incision and drainage, on Keflex, here for follow-up.  Doing well. Still having some pain around the area.  No new problems.  HPI   Prior to Admission medications   Medication Sig Start Date End Date Taking? Authorizing Provider  cephALEXin (KEFLEX) 500 MG capsule Take 1 capsule (500 mg total) by mouth 3 (three) times daily for 7 days. 03/09/18 03/16/18 Yes Frank Galvan, Ines Bloomer, MD  ranitidine (ZANTAC) 150 MG tablet Take 1 tablet (150 mg total) by mouth 2 (two) times daily. Patient not taking: Reported on 03/09/2018 08/28/16   Frank Drape D, PA    No Known Allergies  Patient Active Problem List   Diagnosis Date Noted  . Perianal abscess 03/10/2018  . Anal pain 03/10/2018  . Encounter for incision and drainage procedure 03/10/2018  . Elevated triglycerides with high cholesterol 05/07/2015    Past Medical History:  Diagnosis Date  . Acid reflux     No past surgical history on file.  Social History   Socioeconomic History  . Marital status: Single    Spouse name: Not on file  . Number of children: Not on file  . Years of education: Not on file  . Highest education level: Not on file  Occupational History  . Not on file  Social Needs  . Financial resource strain: Not on file  . Food insecurity:    Worry: Not on file    Inability: Not on file  . Transportation needs:    Medical: Not on file    Non-medical: Not on file  Tobacco Use  . Smoking status: Current Every Day Smoker    Packs/day: 0.12    Years: 16.00    Pack years: 1.92    Types: Cigarettes  . Smokeless tobacco: Never Used  Substance and Sexual Activity  . Alcohol use: Yes    Alcohol/week: 0.0 standard drinks    Comment: 6 beers a day  . Drug use: Yes    Types: Marijuana    Comment: rare  social  . Sexual activity: Yes    Partners: Female  Lifestyle  . Physical activity:    Days per week: Not on file    Minutes per session: Not on file  . Stress: Not on file  Relationships  . Social connections:    Talks on phone: Not on file    Gets together: Not on file    Attends religious service: Not on file    Active member of club or organization: Not on file    Attends meetings of clubs or organizations: Not on file    Relationship status: Not on file  . Intimate partner violence:    Fear of current or ex partner: Not on file    Emotionally abused: Not on file    Physically abused: Not on file    Forced sexual activity: Not on file  Other Topics Concern  . 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 Hypertension Mother   . Cancer Father   . Stroke Maternal Grandfather      Review of  Systems  Constitutional: Negative for chills and fever.  Gastrointestinal: Negative for diarrhea, nausea and vomiting.  All other systems reviewed and are negative.  Vitals:   03/11/18 0935  BP: 119/82  Pulse: 72  Resp: 16  Temp: 98.5 F (36.9 C)  SpO2: 99%     Physical Exam Vitals signs reviewed.  Constitutional:      Appearance: Normal appearance.  Cardiovascular:     Rate and Rhythm: Regular rhythm.  Pulmonary:     Effort: Pulmonary effort is normal.  Genitourinary:   Skin:    General: Skin is warm and dry.  Neurological:     General: No focal deficit present.     Mental Status: He is alert and oriented to person, place, and time.      ASSESSMENT & PLAN: Frank Galvan was seen today for perianal abcess.  Diagnoses and all orders for this visit:  Perianal abscess Comments: Improved  Anal pain    Patient Instructions       If you have lab work done today you will be contacted with your lab results within the next 2 weeks.  If you have  not heard from [place] then please contact us. The fastest way to get your results is to register for My Chart.   IF you received an x-ray today, you will receive an invoice from [facility]. Please contact [facility] at [phone] with questions or concerns regarding your invoice.   IF you received labwork today, you will receive an invoice from [place] [facility]. Please contact [facility] at [phone] with questions or concerns regarding your invoice.   Our billing staff will not be able to assist you with questions regarding bills from these companies.  You will be contacted with the lab results as soon as they are available. The fastest way to get your results is to activate your My Chart account. Instructions are located on the last page of this paperwork. If you have not heard from [place] regarding the results in 2 weeks, please contact this office.     Incision and Drainage, Care After Refer to this sheet in the next few weeks. These instructions provide you with information about caring for yourself after your procedure. Your health care provider may also give you more specific instructions. Your treatment has been planned according to current medical practices, but problems sometimes occur. Call your health care provider if you have any problems or questions after your procedure. What can I expect after the procedure? After the procedure, it is common to have:  Pain or discomfort around your incision site.  Drainage from your incision. Follow these instructions at home:  Take over-the-counter and prescription medicines only as told by your health care provider.  If you were prescribed an antibiotic medicine, take it as told by your health care provider.Do not stop taking the antibiotic even if you start to feel better.  Followinstructions from your health care provider about: ? How to take care of your incision. ? When and how you should change your packing and bandage  (dressing). Wash your hands with soap and water before you change your dressing. If soap and water are not available, use hand sanitizer. ? When you should remove your dressing.  Do not take baths, swim, or use a hot tub until your health care provider approves.  Keep all follow-up visits as told by your health care provider. This is important.  Check your incision area every day for signs of infection. Check for: ? More  redness, swelling, or pain. ? More fluid or blood. ? Warmth. ? Pus or a bad smell. Contact a health care provider if:  Your cyst or abscess returns.  You have a fever.  You have more redness, swelling, or pain around your incision.  You have more fluid or blood coming from your incision.  Your incision feels warm to the touch.  You have pus or a bad smell coming from your incision. Get help right away if:  You have severe pain or bleeding.  You cannot eat or drink without vomiting.  You have decreased urine output.  You become short of breath.  You have chest pain.  You cough up blood.  The area where the incision and drainage occurred becomes numb or it tingles. This information is not intended to replace advice given to you by your health care provider. Make sure you discuss any questions you have with your health care provider. Document Released: 04/17/2011 Document Revised: 06/25/2015 Document Reviewed: 11/13/2014 Elsevier Interactive Patient Education  2019 Elsevier Inc.      Agustina Caroli, MD [facility]

## 2018-03-11 NOTE — Patient Instructions (Addendum)
   If you have lab work done today you will be contacted with your lab results within the next 2 weeks.  If you have not heard from us then please contact us. The fastest way to get your results is to register for My Chart.   IF you received an x-ray today, you will receive an invoice from [facility]. Please contact [facility] at [phone] with questions or concerns regarding your invoice.   IF you received labwork today, you will receive an invoice from [facility]. Please contact [facility] at [phone] with questions or concerns regarding your invoice.   Our billing staff will not be able to assist you with questions regarding bills from these companies.  You will be contacted with the lab results as soon as they are available. The fastest way to get your results is to activate your My Chart account. Instructions are located on the last page of this paperwork. If you have not heard from us regarding the results in 2 weeks, please contact this office.     Incision and Drainage, Care After Refer to this sheet in the next few weeks. These instructions provide you with information about caring for yourself after your procedure. Your health care provider may also give you more specific instructions. Your treatment has been planned according to current medical practices, but problems sometimes occur. Call your health care provider if you have any problems or questions after your procedure. What can I expect after the procedure? After the procedure, it is common to have:  Pain or discomfort around your incision site.  Drainage from your incision. Follow these instructions at home:  Take over-the-counter and prescription medicines only as told by your health care provider.  If you were prescribed an antibiotic medicine, take it as told by your health care provider.Do not stop taking the antibiotic even if you start to feel better.  Followinstructions from your health  care provider about: ? How to take care of your incision. ? When and how you should change your packing and bandage (dressing). Wash your hands with soap and water before you change your dressing. If soap and water are not available, use hand sanitizer. ? When you should remove your dressing.  Do not take baths, swim, or use a hot tub until your health care provider approves.  Keep all follow-up visits as told by your health care provider. This is important.  Check your incision area every day for signs of infection. Check for: ? More redness, swelling, or pain. ? More fluid or blood. ? Warmth. ? Pus or a bad smell. Contact a health care provider if:  Your cyst or abscess returns.  You have a fever.  You have more redness, swelling, or pain around your incision.  You have more fluid or blood coming from your incision.  Your incision feels warm to the touch.  You have pus or a bad smell coming from your incision. Get help right away if:  You have severe pain or bleeding.  You cannot eat or drink without vomiting.  You have decreased urine output.  You become short of breath.  You have chest pain.  You cough up blood.  The area where the incision and drainage occurred becomes numb or it tingles. This information is not intended to replace advice given to you by your health care provider. Make sure you discuss any questions you have with your health care provider. Document Released: 04/17/2011 Document Revised: 06/25/2015 Document Reviewed: 11/13/2014   Elsevier Interactive Patient Education  2019 Elsevier Inc[employer]  

## 2018-03-13 ENCOUNTER — Ambulatory Visit (INDEPENDENT_AMBULATORY_CARE_PROVIDER_SITE_OTHER): Payer: Managed Care, Other (non HMO) | Admitting: Emergency Medicine

## 2018-03-13 ENCOUNTER — Encounter: Payer: Self-pay | Admitting: Emergency Medicine

## 2018-03-13 ENCOUNTER — Other Ambulatory Visit: Payer: Self-pay

## 2018-03-13 VITALS — BP 130/77 | HR 64 | Temp 98.0°F | Resp 16 | Ht 72.0 in | Wt 191.0 lb

## 2018-03-13 DIAGNOSIS — K6289 Other specified diseases of anus and rectum: Secondary | ICD-10-CM

## 2018-03-13 DIAGNOSIS — K610 Anal abscess: Secondary | ICD-10-CM

## 2018-03-13 NOTE — Progress Notes (Signed)
Frank Galvan 38 y.o.   Chief Complaint  Patient presents with  . Wound Check    follow-up for abscess    HISTORY OF PRESENT ILLNESS: This is a 38 y.o. male here for follow-up on perianal abscess.  Status post incision and drainage 5 days ago.  Doing well.  Has no complaints.  Still taking Keflex.  No diarrhea or GI side effects.  HPI   Prior to Admission medications   Medication Sig Start Date End Date Taking? Authorizing Provider  cephALEXin (KEFLEX) 500 MG capsule Take 1 capsule (500 mg total) by mouth 3 (three) times daily for 7 days. 03/09/18 03/16/18 Yes Larue Drawdy, Ines Bloomer, MD  ranitidine (ZANTAC) 150 MG tablet Take 1 tablet (150 mg total) by mouth 2 (two) times daily. 08/28/16  Yes Ivar Drape D, PA    No Known Allergies  Patient Active Problem List   Diagnosis Date Noted  . Perianal abscess 03/10/2018  . Anal pain 03/10/2018  . Encounter for incision and drainage procedure 03/10/2018  . Elevated triglycerides with high cholesterol 05/07/2015    Past Medical History:  Diagnosis Date  . Acid reflux     History reviewed. No pertinent surgical history.  Social History   Socioeconomic History  . Marital status: Single    Spouse name: Not on file  . Number of children: Not on file  . Years of education: Not on file  . Highest education level: Not on file  Occupational History  . Not on file  Social Needs  . Financial resource strain: Not on file  . Food insecurity:    Worry: Not on file    Inability: Not on file  . Transportation needs:    Medical: Not on file    Non-medical: Not on file  Tobacco Use  . Smoking status: Current Every Day Smoker    Packs/day: 0.12    Years: 16.00    Pack years: 1.92    Types: Cigarettes  . Smokeless tobacco: Never Used  Substance and Sexual Activity  . Alcohol use: Yes    Alcohol/week: 0.0 standard drinks    Comment: 6 beers a day  . Drug use: Yes    Types: Marijuana    Comment: rare social  . Sexual  activity: Yes    Partners: Female  Lifestyle  . Physical activity:    Days per week: Not on file    Minutes per session: Not on file  . Stress: Not on file  Relationships  . Social connections:    Talks on phone: Not on file    Gets together: Not on file    Attends religious service: Not on file    Active member of club or organization: Not on file    Attends meetings of clubs or organizations: Not on file    Relationship status: Not on file  . Intimate partner violence:    Fear of current or ex partner: Not on file    Emotionally abused: Not on file    Physically abused: Not on file    Forced sexual activity: Not on file  Other Topics Concern  . 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 Hypertension Mother   . Cancer Father   . Stroke Maternal Grandfather      Review of Systems  Constitutional: Negative for chills  and fever.  Respiratory: Negative for shortness of breath.   Cardiovascular: Negative for chest pain and palpitations.  Gastrointestinal: Negative for abdominal pain, diarrhea, nausea and vomiting.  Genitourinary: Negative.   Musculoskeletal: Negative.   Skin: Negative.  Negative for rash.  Neurological: Negative.   All other systems reviewed and are negative.  Vitals:   03/13/18 1149  BP: 130/77  Pulse: 64  Resp: 16  Temp: 98 F (36.7 C)  SpO2: 97%     Physical Exam Vitals signs reviewed.  Constitutional:      Appearance: Normal appearance.  HENT:     Head: Normocephalic.  Neck:     Musculoskeletal: Normal range of motion.  Cardiovascular:     Rate and Rhythm: Regular rhythm.  Pulmonary:     Effort: Pulmonary effort is normal.  Abdominal:     Palpations: Abdomen is soft.     Tenderness: There is no abdominal tenderness.     Comments: Rectal area: Much improved abscess.  No erythema or significant swelling.  Packing in  place.  Removed.  No drainage.  No need for repacking.  Musculoskeletal: Normal range of motion.  Skin:    General: Skin is warm and dry.     Capillary Refill: Capillary refill takes less than 2 seconds.  Neurological:     General: No focal deficit present.     Mental Status: He is alert and oriented to person, place, and time.      ASSESSMENT & PLAN:  [name] was seen today for wound check.  Diagnoses and all orders for this visit:  Perianal abscess Comments: Improving Orders: -     Cancel: Ambulatory referral to Gastroenterology -     Ambulatory referral to Gastroenterology  Anal pain Comments: Much improved    Patient Instructions       If you have lab work done today you will be contacted with your lab results within the next 2 weeks.  If you have not heard from [place] then please contact us. The fastest way to get your results is to register for My Chart.   IF you received an x-ray today, you will receive an invoice from [facility]. Please contact [facility] at [phone] with questions or concerns regarding your invoice.   IF you received labwork today, you will receive an invoice from [facility]. Please contact [facility] at [phone] with questions or concerns regarding your invoice.   Our billing staff will not be able to assist you with questions regarding bills from these companies.  You will be contacted with the lab results as soon as they are available. The fastest way to get your results is to activate your My Chart account. Instructions are located on the last page of this paperwork. If you have not heard from [place] regarding the results in 2 weeks, please contact this office.      Anorectal Abscess An abscess is an infected area that contains a collection of pus. An anorectal abscess is an abscess that is near the opening of the anus or around the rectum. Without treatment, an anorectal abscess can become larger and cause other  problems, such as a more serious body-wide infection or pain, especially during bowel movements. What are the causes? This condition is caused by plugged glands or an infection in one of these areas:  The anus.  The area between the anus and the scrotum in males or between the anus and the vagina in females (perineum). What increases the risk? The following  factors may make you more likely to develop this condition:  Diabetes or inflammatory bowel disease.  Having a body defense system (immune system) that is weak.  Engaging in anal sex.  Having a sexually transmitted infection (STI).  Certain kinds of cancer, such as rectal carcinoma, leukemia, or lymphoma. What are the signs or symptoms? The main symptom of this condition is pain. The pain may be a throbbing pain that gets worse during bowel movements. Other symptoms include:  Swelling and redness in the area of the abscess. The redness may go beyond the abscess and appear as a red streak on the skin.  A visible, painful lump, or a lump that can be felt when touched.  Bleeding or pus-like discharge from the area.  Fever.  General weakness.  Constipation.  Diarrhea. How is this diagnosed? This condition is diagnosed based on your medical history and a physical exam of the affected area.  This may involve examining the rectal area with a gloved hand (digital rectal exam).  Sometimes, the health care provider needs to look into the rectum using a probe, scope, or imaging test.  For women, it may require a careful vaginal exam. How is this treated? Treatment for this condition may include:  Incision and drainage surgery. This involves making an incision over the abscess to drain the pus.  Medicines, including antibiotic medicine, pain medicine, stool softeners, or laxatives. Follow these instructions at home: Medicines  Take over-the-counter and prescription medicines only as told by your health care provider.  If  you were prescribed an antibiotic medicine, use it as told by your health care provider. Do not stop using the antibiotic even if you start to feel better.  Do not drive or use heavy machinery while taking prescription pain medicine. Wound care   If gauze was used in the abscess, follow instructions from your health care provider about removing or changing the gauze. It can usually be removed in 2-3 days.  Wash your hands with soap and water before you remove or change your gauze. If soap and water are not available, use hand sanitizer.  If one or more drains were placed in the abscess cavity, be careful not to pull at them. Your health care provider will tell you how long they need to remain in place.  Check your incision area every day for signs of infection. Check for: ? More redness, swelling, or pain. ? More fluid or blood. ? Warmth. ? Pus or a bad smell. Managing pain, stiffness, and swelling   Take a sitz bath 3-4 times a day and after bowel movements. This will help reduce pain and swelling.  To relieve pain, try sitting: ? On a heating pad with the setting on low. ? On an inflatable donut-shaped cushion.  If directed, put ice on the affected area: ? Put ice in a plastic bag. ? Place a towel between your skin and the bag. ? Leave the ice on for 20 minutes, 2-3 times a day. General instructions  Follow any diet instructions given by your health care provider.  Keep all follow-up visits as told by your health care provider. This is important. Contact a health care provider if you have:  Bleeding from your incision.  Pain, swelling, or redness that does not improve or gets worse.  Trouble passing stool or urine.  Symptoms that return after treatment. Get help right away if you:  Have problems moving or using your legs.  Have severe or increasing pain.  Have swelling in the affected area that suddenly gets worse.  Have a large increase in bleeding or passing of  pus.  Develop chills or a fever. Summary  An anorectal abscess is an abscess that is near the opening of the anus or around the rectum. An abscess is an infected area that contains a collection of pus.  The main symptom of this condition is pain. It may be a throbbing pain that gets worse during bowel movements.  Treatment for an anorectal abscess may include surgery to drain the pus from the abscess. Medicines and sitz baths may also be a part of your treatment plan. This information is not intended to replace advice given to you by your health care provider. Make sure you discuss any questions you have with your health care provider. Document Released: 01/21/2000 Document Revised: 03/01/2017 Document Reviewed: 03/01/2017 Elsevier Interactive Patient Education  2019 Elsevier Inc.     Agustina Caroli, MD [facility]

## 2018-03-13 NOTE — Patient Instructions (Addendum)
If you have lab work done today you will be contacted with your lab results within the next 2 weeks.  If you have not heard from [place] then please contact us. The fastest way to get your results is to register for My Chart.   IF you received an x-ray today, you will receive an invoice from [facility]. Please contact [facility] at [phone] with questions or concerns regarding your invoice.   IF you received labwork today, you will receive an invoice from [facility]. Please contact [facility] at [phone] with questions or concerns regarding your invoice.   Our billing staff will not be able to assist you with questions regarding bills from these companies.  You will be contacted with the lab results as soon as they are available. The fastest way to get your results is to activate your My Chart account. Instructions are located on the last page of this paperwork. If you have not heard from [place] regarding the results in 2 weeks, please contact this office.      Anorectal Abscess An abscess is an infected area that contains a collection of pus. An anorectal abscess is an abscess that is near the opening of the anus or around the rectum. Without treatment, an anorectal abscess can become larger and cause other problems, such as a more serious body-wide infection or pain, especially during bowel movements. What are the causes? This condition is caused by plugged glands or an infection in one of these areas:  The anus.  The area between the anus and the scrotum in males or between the anus and the vagina in females (perineum). What increases the risk? The following factors may make you more likely to develop this condition:  Diabetes or inflammatory bowel disease.  Having a body defense system (immune system) that is weak.  Engaging in anal sex.  Having a sexually transmitted infection (STI).  Certain kinds of cancer, such as rectal carcinoma, leukemia, or  lymphoma. What are the signs or symptoms? The main symptom of this condition is pain. The pain may be a throbbing pain that gets worse during bowel movements. Other symptoms include:  Swelling and redness in the area of the abscess. The redness may go beyond the abscess and appear as a red streak on the skin.  A visible, painful lump, or a lump that can be felt when touched.  Bleeding or pus-like discharge from the area.  Fever.  General weakness.  Constipation.  Diarrhea. How is this diagnosed? This condition is diagnosed based on your medical history and a physical exam of the affected area.  This may involve examining the rectal area with a gloved hand (digital rectal exam).  Sometimes, the health care provider needs to look into the rectum using a probe, scope, or imaging test.  For women, it may require a careful vaginal exam. How is this treated? Treatment for this condition may include:  Incision and drainage surgery. This involves making an incision over the abscess to drain the pus.  Medicines, including antibiotic medicine, pain medicine, stool softeners, or laxatives. Follow these instructions at home: Medicines  Take over-the-counter and prescription medicines only as told by your health care provider.  If you were prescribed an antibiotic medicine, use it as told by your health care provider. Do not stop using the antibiotic even if you start to feel better.  Do not drive or use heavy machinery while taking prescription pain medicine. Wound care   If gauze  was used in the abscess, follow instructions from your health care provider about removing or changing the gauze. It can usually be removed in 2-3 days.  Wash your hands with soap and water before you remove or change your gauze. If soap and water are not available, use hand sanitizer.  If one or more drains were placed in the abscess cavity, be careful not to pull at them. Your health care provider will  tell you how long they need to remain in place.  Check your incision area every day for signs of infection. Check for: ? More redness, swelling, or pain. ? More fluid or blood. ? Warmth. ? Pus or a bad smell. Managing pain, stiffness, and swelling   Take a sitz bath 3-4 times a day and after bowel movements. This will help reduce pain and swelling.  To relieve pain, try sitting: ? On a heating pad with the setting on low. ? On an inflatable donut-shaped cushion.  If directed, put ice on the affected area: ? Put ice in a plastic bag. ? Place a towel between your skin and the bag. ? Leave the ice on for 20 minutes, 2-3 times a day. General instructions  Follow any diet instructions given by your health care provider.  Keep all follow-up visits as told by your health care provider. This is important. Contact a health care provider if you have:  Bleeding from your incision.  Pain, swelling, or redness that does not improve or gets worse.  Trouble passing stool or urine.  Symptoms that return after treatment. Get help right away if you:  Have problems moving or using your legs.  Have severe or increasing pain.  Have swelling in the affected area that suddenly gets worse.  Have a large increase in bleeding or passing of pus.  Develop chills or a fever. Summary  An anorectal abscess is an abscess that is near the opening of the anus or around the rectum. An abscess is an infected area that contains a collection of pus.  The main symptom of this condition is pain. It may be a throbbing pain that gets worse during bowel movements.  Treatment for an anorectal abscess may include surgery to drain the pus from the abscess. Medicines and sitz baths may also be a part of your treatment plan. This information is not intended to replace advice given to you by your health care provider. Make sure you discuss any questions you have with your health care provider. Document Released:  01/21/2000 Document Revised: 03/01/2017 Document Reviewed: 03/01/2017 Elsevier Interactive Patient Education  2019 [employer].

## 2018-03-21 ENCOUNTER — Encounter: Payer: Self-pay | Admitting: Gastroenterology

## 2018-04-09 ENCOUNTER — Encounter: Payer: Self-pay | Admitting: Gastroenterology

## 2018-04-09 ENCOUNTER — Ambulatory Visit (INDEPENDENT_AMBULATORY_CARE_PROVIDER_SITE_OTHER): Payer: Managed Care, Other (non HMO) | Admitting: Gastroenterology

## 2018-04-09 VITALS — BP 110/84 | HR 68 | Ht 72.25 in | Wt 191.1 lb

## 2018-04-09 DIAGNOSIS — K610 Anal abscess: Secondary | ICD-10-CM

## 2018-04-09 DIAGNOSIS — R197 Diarrhea, unspecified: Secondary | ICD-10-CM

## 2018-04-09 MED ORDER — PEG 3350-KCL-NABCB-NACL-NASULF 236 G PO SOLR: 4000.0000 mL | Freq: Once | ORAL | 0 refills | Status: AC

## 2018-04-09 NOTE — Patient Instructions (Signed)
Please start taking citrucel (orange flavored) powder fiber supplement.  This may cause some bloating at first but that usually goes away. Begin with a small spoonful and work your way up to a large, heaping spoonful daily over a week.  You will be set up for a colonoscopy for intermittent diarrhea, perianal abscess.

## 2018-04-09 NOTE — Progress Notes (Signed)
HPI: This is a very pleasant 38 year old man   who was referred to me by Horald Pollen, *  to evaluate perianal abscess.    Chief complaint is perianal abscess  He reports that he had a small perianal abscess about a year ago.  Presented with a painful lump at his anus.  He went to an urgent clinic and had it drained and did well until about a year later which was a month ago when he again experienced that pain and a lump in his left perianus.  Sought attention for it and ended up having incision and drainage with slow packing to resolve it.  Never constipated.  He does have mild intermittent diarrhea.  About once per week he will have one loose stool, never bleeding.  No abd pains.  Weight steady.  Between loose stools he is pretty regular  Etoh beer; usually 4-5 per day.  Used to be 9 daily, 12 daily.  No Crohn's disease or colitis in his family.  No anal trauma that he is aware of.    Review of systems: Pertinent positive and negative review of systems were noted in the above HPI section. All other review negative.   Past Medical History:  Diagnosis Date  . Acid reflux   . Perianal abscess     Past Surgical History:  Procedure Laterality Date  . NO PAST SURGERIES      Current Outpatient Medications  Medication Sig Dispense Refill  . famotidine (PEPCID) 20 MG tablet Take 20 mg by mouth 2 (two) times daily.     No current facility-administered medications for this visit.     Allergies as of 04/09/2018  . (No Known Allergies)    Family History  Problem Relation Age of Onset  . Hypertension Mother   . Prostate cancer Father   . Stroke Maternal Grandfather   . Ovarian cancer Paternal Grandmother   . Prostate cancer Paternal Grandfather     Social History   Socioeconomic History  . Marital status: Single    Spouse name: Not on file  . Number of children: 0  . Years of education: Not on file  . Highest education level: Not on file  Occupational  History  . Occupation: Engineer, water  . Financial resource strain: Not on file  . Food insecurity:    Worry: Not on file    Inability: Not on file  . Transportation needs:    Medical: Not on file    Non-medical: Not on file  Tobacco Use  . Smoking status: Current Every Day Smoker    Packs/day: 0.12    Years: 16.00    Pack years: 1.92    Types: Cigarettes  . Smokeless tobacco: Former Systems developer    Types: Chew  Substance and Sexual Activity  . Alcohol use: Yes    Alcohol/week: 0.0 standard drinks    Comment: 6 beers a day  . Drug use: Yes    Types: Marijuana    Comment: rare social  . Sexual activity: Yes    Partners: Female  Lifestyle  . Physical activity:    Days per week: Not on file    Minutes per session: Not on file  . Stress: Not on file  Relationships  . Social connections:    Talks on phone: Not on file    Gets together: Not on file    Attends religious service: Not on file    Active member of club or organization: Not on  file    Attends meetings of clubs or organizations: Not on file    Relationship status: Not on file  . Intimate partner violence:    Fear of current or ex partner: Not on file    Emotionally abused: Not on file    Physically abused: Not on file    Forced sexual activity: Not on file  Other Topics Concern  . Not on file  Social History Narrative   Single   No children   Engineer, site   No current exercise     Physical Exam: BP 110/84 (BP Location: Left Arm, Patient Position: Sitting, Cuff Size: Normal)   Pulse 68   Ht 6' 0.25" (1.835 m) Comment: height measured without shoes  Wt 191 lb 2 oz (86.7 kg)   BMI 25.74 kg/m  Constitutional: generally well-appearing Psychiatric: alert and oriented x3 Eyes: extraocular movements intact Mouth: oral pharynx moist, no lesions Neck: supple no lymphadenopathy Cardiovascular: heart regular rate and rhythm Lungs: clear to auscultation bilaterally Abdomen: soft, nontender,  nondistended, no obvious ascites, no peritoneal signs, normal bowel sounds Extremities: no lower extremity edema bilaterally Skin: no lesions on visible extremities Rectal exam: No obvious anal fissures, no fluctuance or tenderness at his anus, the left anus tissue was very mildly thickened feeling, no distal rectal masses, stool was brown and not checked for Hemoccult.  Anus was not particularly strictured.  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  In that region of the anus on the left side the tissue seems slightly thick but certainly there is no fluctuance and it was not tender.  We discussed possible underlying Crohn's disease which I think is probably unlikely here.  I recommended colonoscopy to be more certain of that exam and his distal rectum also check his terminal ileum for subtle signs of underlying inflammatory bowel disease.  In the meantime I put him on fiber supplements try to get his bowel habits more regular.  He does have intermittent times of loose stools.  We spoke a bit about his alcohol abuse.  He was drinking a half a case of beer daily but has certainly cut back in the last few weeks or months down to 3-5 beers daily.  He does understand that he might be an alcoholic and I urged him to try to cut back or quit as best that he can.    Please see the "Patient Instructions" section for addition details about the plan.   Owens Loffler, MD [facility] 04/09/2018, 8:31 AM  Cc: Horald Pollen, *

## 2018-05-22 ENCOUNTER — Encounter: Payer: Managed Care, Other (non HMO) | Admitting: Gastroenterology

## 2018-05-27 ENCOUNTER — Encounter: Payer: Managed Care, Other (non HMO) | Admitting: Gastroenterology

## 2018-06-18 ENCOUNTER — Encounter: Payer: Managed Care, Other (non HMO) | Admitting: Gastroenterology

## 2018-08-20 ENCOUNTER — Encounter: Payer: Managed Care, Other (non HMO) | Admitting: Emergency Medicine

## 2018-08-26 ENCOUNTER — Ambulatory Visit (INDEPENDENT_AMBULATORY_CARE_PROVIDER_SITE_OTHER): Payer: Managed Care, Other (non HMO) | Admitting: Emergency Medicine

## 2018-08-26 ENCOUNTER — Other Ambulatory Visit: Payer: Self-pay

## 2018-08-26 ENCOUNTER — Encounter: Payer: Self-pay | Admitting: Emergency Medicine

## 2018-08-26 VITALS — BP 144/84 | HR 62 | Temp 98.6°F | Resp 18 | Ht 72.25 in | Wt 192.6 lb

## 2018-08-26 DIAGNOSIS — Z0000 Encounter for general adult medical examination without abnormal findings: Secondary | ICD-10-CM | POA: Diagnosis not present

## 2018-08-26 DIAGNOSIS — Z8042 Family history of malignant neoplasm of prostate: Secondary | ICD-10-CM

## 2018-08-26 DIAGNOSIS — Z13228 Encounter for screening for other metabolic disorders: Secondary | ICD-10-CM

## 2018-08-26 DIAGNOSIS — Z1329 Encounter for screening for other suspected endocrine disorder: Secondary | ICD-10-CM | POA: Diagnosis not present

## 2018-08-26 DIAGNOSIS — Z13220 Encounter for screening for lipoid disorders: Secondary | ICD-10-CM | POA: Diagnosis not present

## 2018-08-26 DIAGNOSIS — Z130 Encounter for screening for diseases of the blood and blood-forming organs and certain disorders involving the immune mechanism: Secondary | ICD-10-CM | POA: Diagnosis not present

## 2018-08-26 NOTE — Patient Instructions (Addendum)
Health Maintenance, Male Adopting a healthy lifestyle and getting preventive care are important in promoting health and wellness. Ask your health care provider about:  The right schedule for you to have regular tests and exams.  Things you can do on your own to prevent diseases and keep yourself healthy. What should I know about diet, weight, and exercise? Eat a healthy diet   Eat a diet that includes plenty of vegetables, fruits, low-fat dairy products, and lean protein.  Do not eat a lot of foods that are high in solid fats, added sugars, or sodium. Maintain a healthy weight Body mass index (BMI) is a measurement that can be used to identify possible weight problems. It estimates body fat based on height and weight. Your health care provider can help determine your BMI and help you achieve or maintain a healthy weight. Get regular exercise Get regular exercise. This is one of the most important things you can do for your health. Most adults should:  Exercise for at least 150 minutes each week. The exercise should increase your heart rate and make you sweat (moderate-intensity exercise).  Do strengthening exercises at least twice a week. This is in addition to the moderate-intensity exercise.  Spend less time sitting. Even light physical activity can be beneficial. Watch cholesterol and blood lipids Have your blood tested for lipids and cholesterol at 36 years of age, then have this test every 5 years. You may need to have your cholesterol levels checked more often if:  Your lipid or cholesterol levels are high.  You are older than 36 years of age.  You are at high risk for heart disease. What should I know about cancer screening? Many types of cancers can be detected early and may often be prevented. Depending on your health history and family history, you may need to have cancer screening at various ages. This may include screening for:  Colorectal cancer.  Prostate  cancer.  Skin cancer.  Lung cancer. What should I know about heart disease, diabetes, and high blood pressure? Blood pressure and heart disease  High blood pressure causes heart disease and increases the risk of stroke. This is more likely to develop in people who have high blood pressure readings, are of African descent, or are overweight.  Talk with your health care provider about your target blood pressure readings.  Have your blood pressure checked: ? Every 3-5 years if you are 18-39 years of age. ? Every year if you are 40 years old or older.  If you are between the ages of 65 and 75 and are a current or former smoker, ask your health care provider if you should have a one-time screening for abdominal aortic aneurysm (AAA). Diabetes Have regular diabetes screenings. This checks your fasting blood sugar level. Have the screening done:  Once every three years after age 45 if you are at a normal weight and have a low risk for diabetes.  More often and at a younger age if you are overweight or have a high risk for diabetes. What should I know about preventing infection? Hepatitis B If you have a higher risk for hepatitis B, you should be screened for this virus. Talk with your health care provider to find out if you are at risk for hepatitis B infection. Hepatitis C Blood testing is recommended for:  Everyone born from 1945 through 1965.  Anyone with known risk factors for hepatitis C. Sexually transmitted infections (STIs)  You should be screened each year   for STIs, including gonorrhea and chlamydia, if: ? You are sexually active and are younger than 36 years of age. ? You are older than 36 years of age and your health care provider tells you that you are at risk for this type of infection. ? Your sexual activity has changed since you were last screened, and you are at increased risk for chlamydia or gonorrhea. Ask your health care provider if you are at risk.  Ask your  health care provider about whether you are at high risk for HIV. Your health care provider may recommend a prescription medicine to help prevent HIV infection. If you choose to take medicine to prevent HIV, you should first get tested for HIV. You should then be tested every 3 months for as long as you are taking the medicine. Follow these instructions at home: Lifestyle  Do not use any products that contain nicotine or tobacco, such as cigarettes, e-cigarettes, and chewing tobacco. If you need help quitting, ask your health care provider.  Do not use street drugs.  Do not share needles.  Ask your health care provider for help if you need support or information about quitting drugs. Alcohol use  Do not drink alcohol if your health care provider tells you not to drink.  If you drink alcohol: ? Limit how much you have to 0-2 drinks a day. ? Be aware of how much alcohol is in your drink. In the [place] one drink equals one 12 oz bottle of beer (355 mL), one 5 oz glass of wine (148 mL), or one 1 oz glass of hard liquor (44 mL). General instructions  Schedule regular health, dental, and eye exams.  Stay current with your vaccines.  Tell your health care provider if: ? You often feel depressed. ? You have ever been abused or do not feel safe at home. Summary  Adopting a healthy lifestyle and getting preventive care are important in promoting health and wellness.  Follow your health care provider's instructions about healthy diet, exercising, and getting tested or screened for diseases.  Follow your health care provider's instructions on monitoring your cholesterol and blood pressure. This information is not intended to replace advice given to you by your health care provider. Make sure you discuss any questions you have with your health care provider. Document Released: 07/22/2007 Document Revised: 01/16/2018 Document Reviewed: 01/16/2018 Elsevier Patient Education  [employer].     If you have lab work done today you will be contacted with your lab results within the next 2 weeks.  If you have not heard from [place] then please contact us. The fastest way to get your results is to register for My Chart.   IF you received an x-ray today, you will receive an invoice from [facility]. Please contact [facility] at [phone] with questions or concerns regarding your invoice.   IF you received labwork today, you will receive an invoice from [facility]. Please contact [facility] at [phone] with questions or concerns regarding your invoice.   Our billing staff will not be able to assist you with questions regarding bills from these companies.  You will be contacted with the lab results as soon as they are available. The fastest way to get your results is to activate your My Chart account. Instructions are located on the last page of this paperwork. If you have not heard from [place] regarding the results in 2 weeks, please contact this office.       Health  Maintenance, Male Adopting a healthy lifestyle and getting preventive care are important in promoting health and wellness. Ask your health care provider about:  The right schedule for you to have regular tests and exams.  Things you can do on your own to prevent diseases and keep yourself healthy. What should I know about diet, weight, and exercise? Eat a healthy diet   Eat a diet that includes plenty of vegetables, fruits, low-fat dairy products, and lean protein.  Do not eat a lot of foods that are high in solid fats, added sugars, or sodium. Maintain a healthy weight Body mass index (BMI) is a measurement that can be used to identify possible weight problems. It estimates body fat based on height and weight. Your health care provider can help determine your BMI and help you achieve or maintain a healthy weight. Get regular exercise Get regular exercise. This is one of the most important  things you can do for your health. Most adults should:  Exercise for at least 150 minutes each week. The exercise should increase your heart rate and make you sweat (moderate-intensity exercise).  Do strengthening exercises at least twice a week. This is in addition to the moderate-intensity exercise.  Spend less time sitting. Even light physical activity can be beneficial. Watch cholesterol and blood lipids Have your blood tested for lipids and cholesterol at 36 years of age, then have this test every 5 years. You may need to have your cholesterol levels checked more often if:  Your lipid or cholesterol levels are high.  You are older than 36 years of age.  You are at high risk for heart disease. What should I know about cancer screening? Many types of cancers can be detected early and may often be prevented. Depending on your health history and family history, you may need to have cancer screening at various ages. This may include screening for:  Colorectal cancer.  Prostate cancer.  Skin cancer.  Lung cancer. What should I know about heart disease, diabetes, and high blood pressure? Blood pressure and heart disease  High blood pressure causes heart disease and increases the risk of stroke. This is more likely to develop in people who have high blood pressure readings, are of African descent, or are overweight.  Talk with your health care provider about your target blood pressure readings.  Have your blood pressure checked: ? Every 3-5 years if you are 13-20 years of age. ? Every year if you are 9 years old or older.  If you are between the ages of 47 and 80 and are a current or former smoker, ask your health care provider if you should have a one-time screening for abdominal aortic aneurysm (AAA). Diabetes Have regular diabetes screenings. This checks your fasting blood sugar level. Have the screening done:  Once every three years after age 22 if you are at a normal  weight and have a low risk for diabetes.  More often and at a younger age if you are overweight or have a high risk for diabetes. What should I know about preventing infection? Hepatitis B If you have a higher risk for hepatitis B, you should be screened for this virus. Talk with your health care provider to find out if you are at risk for hepatitis B infection. Hepatitis C Blood testing is recommended for:  Everyone born from [birth year] through 1965.  Anyone with known risk factors for hepatitis C. Sexually transmitted infections (STIs)  You should be screened each year  for STIs, including gonorrhea and chlamydia, if: ? You are sexually active and are younger than 36 years of age. ? You are older than 36 years of age and your health care provider tells you that you are at risk for this type of infection. ? Your sexual activity has changed since you were last screened, and you are at increased risk for chlamydia or gonorrhea. Ask your health care provider if you are at risk.  Ask your health care provider about whether you are at high risk for HIV. Your health care provider may recommend a prescription medicine to help prevent HIV infection. If you choose to take medicine to prevent HIV, you should first get tested for HIV. You should then be tested every 3 months for as long as you are taking the medicine. Follow these instructions at home: Lifestyle  Do not use any products that contain nicotine or tobacco, such as cigarettes, e-cigarettes, and chewing tobacco. If you need help quitting, ask your health care provider.  Do not use street drugs.  Do not share needles.  Ask your health care provider for help if you need support or information about quitting drugs. Alcohol use  Do not drink alcohol if your health care provider tells you not to drink.  If you drink alcohol: ? Limit how much you have to 0-2 drinks a day. ? Be aware of how much alcohol is in your drink. In the [place] one  drink equals one 12 oz bottle of beer (355 mL), one 5 oz glass of wine (148 mL), or one 1 oz glass of hard liquor (44 mL). General instructions  Schedule regular health, dental, and eye exams.  Stay current with your vaccines.  Tell your health care provider if: ? You often feel depressed. ? You have ever been abused or do not feel safe at home. Summary  Adopting a healthy lifestyle and getting preventive care are important in promoting health and wellness.  Follow your health care provider's instructions about healthy diet, exercising, and getting tested or screened for diseases.  Follow your health care provider's instructions on monitoring your cholesterol and blood pressure. This information is not intended to replace advice given to you by your health care provider. Make sure you discuss any questions you have with your health care provider. Document Released: 07/22/2007 Document Revised: 01/16/2018 Document Reviewed: 01/16/2018 Elsevier Patient Education  2020 [employer].

## 2018-08-26 NOTE — Progress Notes (Signed)
Frank Galvan 38 y.o.   Chief Complaint  Patient presents with  . Annual Exam    denies any other issues at this time; does not want any STD testing    HISTORY OF PRESENT ILLNESS: This is a 38 y.o. male here for annual exam.  Has no complaints or medical concerns. Was seen by me once with a perianal abscess.  Was also seen by GI doctor who wants to do a colonoscopy.  Not scheduled yet. Daily smoker, 4 to 5 cigarettes a day. Daily drinker, 5-6 beers per day. Works regular hours.  Adequate sleep and nutrition. Has a paternal family history of prostate cancer.  He is asymptomatic.  HPI   Prior to Admission medications   Medication Sig Start Date End Date Taking? Authorizing Provider  famotidine (PEPCID) 20 MG tablet Take 20 mg by mouth 2 (two) times daily.   Yes [provider]    No Known Allergies  Patient Active Problem List   Diagnosis Date Noted  . Elevated triglycerides with high cholesterol 05/07/2015    Past Medical History:  Diagnosis Date  . Acid reflux   . Perianal abscess     Past Surgical History:  Procedure Laterality Date  . NO PAST SURGERIES      Social History   Socioeconomic History  . Marital status: Single    Spouse name: Not on file  . Number of children: 0  . Years of education: Not on file  . Highest education level: Not on file  Occupational History  . Occupation: Engineer, water  . Financial resource strain: Not on file  . Food insecurity    Worry: Not on file    Inability: Not on file  . Transportation needs    Medical: Not on file    Non-medical: Not on file  Tobacco Use  . Smoking status: Current Every Day Smoker    Packs/day: 0.12    Years: 16.00    Pack years: 1.92    Types: Cigarettes  . Smokeless tobacco: Former Systems developer    Types: Chew  Substance and Sexual Activity  . Alcohol use: Yes    Alcohol/week: 0.0 standard drinks    Comment: 6 beers a day  . Drug use: Yes    Types: Marijuana    Comment:  rare social  . Sexual activity: Yes    Partners: Female  Lifestyle  . Physical activity    Days per week: Not on file    Minutes per session: Not on file  . Stress: Not on file  Relationships  . Social Herbalist on phone: Not on file    Gets together: Not on file    Attends religious service: Not on file    Active member of club or organization: Not on file    Attends meetings of clubs or organizations: Not on file    Relationship status: Not on file  . Intimate partner violence    Fear of current or ex partner: Not on file    Emotionally abused: Not on file    Physically abused: Not on file    Forced sexual activity: Not on file  Other Topics Concern  . 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 Hypertension Mother   . Prostate cancer Father   . Stroke  Maternal Grandfather   . Ovarian cancer Paternal Grandmother   . Prostate cancer Paternal Grandfather      Review of Systems  Constitutional: Negative.  Negative for chills, fever and weight loss.  HENT: Negative.  Negative for congestion, nosebleeds and sore throat.   Eyes: Negative.  Negative for blurred vision and double vision.  Respiratory: Negative.  Negative for cough and shortness of breath.   Cardiovascular: Negative.  Negative for chest pain, palpitations and leg swelling.  Gastrointestinal: Negative.  Negative for abdominal pain, blood in stool, constipation, diarrhea, nausea and vomiting.  Genitourinary: Negative.  Negative for dysuria, frequency and hematuria.  Musculoskeletal: Negative.   Skin: Negative.  Negative for rash.  Neurological: Negative for dizziness and headaches.  Endo/Heme/Allergies: Negative.   All other systems reviewed and are negative.   Vitals:   08/26/18 0859  BP: (!) 144/84  Pulse: 62  Resp: 18  Temp: 98.6 F (37 C)  SpO2: 96%    Physical Exam  Vitals signs reviewed.  Constitutional:      Appearance: Normal appearance.  HENT:     Head: Normocephalic and atraumatic.     Nose: Nose normal.     Mouth/Throat:     Mouth: Mucous membranes are moist.     Pharynx: Oropharynx is clear.  Eyes:     Extraocular Movements: Extraocular movements intact.     Conjunctiva/sclera: Conjunctivae normal.     Pupils: Pupils are equal, round, and reactive to light.  Neck:     Musculoskeletal: Normal range of motion and neck supple.  Cardiovascular:     Rate and Rhythm: Normal rate and regular rhythm.     Pulses: Normal pulses.     Heart sounds: Normal heart sounds.  Pulmonary:     Effort: Pulmonary effort is normal.     Breath sounds: Normal breath sounds.  Abdominal:     General: Bowel sounds are normal. There is no distension.     Palpations: Abdomen is soft. There is no mass.     Tenderness: There is no abdominal tenderness.     Hernia: No hernia is present.  Musculoskeletal: Normal range of motion.  Skin:    General: Skin is warm and dry.     Capillary Refill: Capillary refill takes less than 2 seconds.  Neurological:     General: No focal deficit present.     Mental Status: He is alert and oriented to person, place, and time.  Psychiatric:        Mood and Affect: Mood normal.        Behavior: Behavior normal.      ASSESSMENT & PLAN: Frank Galvan was seen today for annual exam.  Diagnoses and all orders for this visit:  Routine general medical examination at a health care facility  Screening for deficiency anemia -     CBC  Screening for lipoid disorders -     Lipid panel  Screening for endocrine, metabolic and immunity disorder -     TSH -     CMP14+EGFR  Family history of prostate cancer -     PSA    Patient Instructions   Health Maintenance, Male Adopting a healthy lifestyle and getting preventive care are important in promoting health and wellness. Ask your health care provider about:  The right schedule for  you to have regular tests and exams.  Things you can do on your own to prevent diseases and keep yourself healthy. What should I know about diet, weight, and exercise?  Eat a healthy diet   Eat a diet that includes plenty of vegetables, fruits, low-fat dairy products, and lean protein.  Do not eat a lot of foods that are high in solid fats, added sugars, or sodium. Maintain a healthy weight Body mass index (BMI) is a measurement that can be used to identify possible weight problems. It estimates body fat based on height and weight. Your health care provider can help determine your BMI and help you achieve or maintain a healthy weight. Get regular exercise Get regular exercise. This is one of the most important things you can do for your health. Most adults should:  Exercise for at least 150 minutes each week. The exercise should increase your heart rate and make you sweat (moderate-intensity exercise).  Do strengthening exercises at least twice a week. This is in addition to the moderate-intensity exercise.  Spend less time sitting. Even light physical activity can be beneficial. Watch cholesterol and blood lipids Have your blood tested for lipids and cholesterol at 36 years of age, then have this test every 5 years. You may need to have your cholesterol levels checked more often if:  Your lipid or cholesterol levels are high.  You are older than 36 years of age.  You are at high risk for heart disease. What should I know about cancer screening? Many types of cancers can be detected early and may often be prevented. Depending on your health history and family history, you may need to have cancer screening at various ages. This may include screening for:  Colorectal cancer.  Prostate cancer.  Skin cancer.  Lung cancer. What should I know about heart disease, diabetes, and high blood pressure? Blood pressure and heart disease  High blood pressure causes heart disease and  increases the risk of stroke. This is more likely to develop in people who have high blood pressure readings, are of African descent, or are overweight.  Talk with your health care provider about your target blood pressure readings.  Have your blood pressure checked: ? Every 3-5 years if you are 85-42 years of age. ? Every year if you are 29 years old or older.  If you are between the ages of 85 and 94 and are a current or former smoker, ask your health care provider if you should have a one-time screening for abdominal aortic aneurysm (AAA). Diabetes Have regular diabetes screenings. This checks your fasting blood sugar level. Have the screening done:  Once every three years after age 78 if you are at a normal weight and have a low risk for diabetes.  More often and at a younger age if you are overweight or have a high risk for diabetes. What should I know about preventing infection? Hepatitis B If you have a higher risk for hepatitis B, you should be screened for this virus. Talk with your health care provider to find out if you are at risk for hepatitis B infection. Hepatitis C Blood testing is recommended for:  Everyone born from [birth year] through 1963.  Anyone with known risk factors for hepatitis C. Sexually transmitted infections (STIs)  You should be screened each year for STIs, including gonorrhea and chlamydia, if: ? You are sexually active and are younger than 36 years of age. ? You are older than 36 years of age and your health care provider tells you that you are at risk for this type of infection. ? Your sexual activity has changed since you were last screened, and you are  at increased risk for chlamydia or gonorrhea. Ask your health care provider if you are at risk.  Ask your health care provider about whether you are at high risk for HIV. Your health care provider may recommend a prescription medicine to help prevent HIV infection. If you choose to take medicine to prevent  HIV, you should first get tested for HIV. You should then be tested every 3 months for as long as you are taking the medicine. Follow these instructions at home: Lifestyle  Do not use any products that contain nicotine or tobacco, such as cigarettes, e-cigarettes, and chewing tobacco. If you need help quitting, ask your health care provider.  Do not use street drugs.  Do not share needles.  Ask your health care provider for help if you need support or information about quitting drugs. Alcohol use  Do not drink alcohol if your health care provider tells you not to drink.  If you drink alcohol: ? Limit how much you have to 0-2 drinks a day. ? Be aware of how much alcohol is in your drink. In the [place] one drink equals one 12 oz bottle of beer (355 mL), one 5 oz glass of wine (148 mL), or one 1 oz glass of hard liquor (44 mL). General instructions  Schedule regular health, dental, and eye exams.  Stay current with your vaccines.  Tell your health care provider if: ? You often feel depressed. ? You have ever been abused or do not feel safe at home. Summary  Adopting a healthy lifestyle and getting preventive care are important in promoting health and wellness.  Follow your health care provider's instructions about healthy diet, exercising, and getting tested or screened for diseases.  Follow your health care provider's instructions on monitoring your cholesterol and blood pressure. This information is not intended to replace advice given to you by your health care provider. Make sure you discuss any questions you have with your health care provider. Document Released: 07/22/2007 Document Revised: 01/16/2018 Document Reviewed: 01/16/2018 Elsevier Patient Education  [employer].     If you have lab work done today you will be contacted with your lab results within the next 2 weeks.  If you have not heard from [place] then please contact us. The fastest way to get your results is  to register for My Chart.   IF you received an x-ray today, you will receive an invoice from [facility]. Please contact [facility] at [phone] with questions or concerns regarding your invoice.   IF you received labwork today, you will receive an invoice from [facility]. Please contact [facility] at [phone] with questions or concerns regarding your invoice.   Our billing staff will not be able to assist you with questions regarding bills from these companies.  You will be contacted with the lab results as soon as they are available. The fastest way to get your results is to activate your My Chart account. Instructions are located on the last page of this paperwork. If you have not heard from [place] regarding the results in 2 weeks, please contact this office.       Health Maintenance, Male Adopting a healthy lifestyle and getting preventive care are important in promoting health and wellness. Ask your health care provider about:  The right schedule for you to have regular tests and exams.  Things you can do on your own to prevent diseases and keep yourself healthy. What should I know about diet, weight, and exercise? Eat  a healthy diet   Eat a diet that includes plenty of vegetables, fruits, low-fat dairy products, and lean protein.  Do not eat a lot of foods that are high in solid fats, added sugars, or sodium. Maintain a healthy weight Body mass index (BMI) is a measurement that can be used to identify possible weight problems. It estimates body fat based on height and weight. Your health care provider can help determine your BMI and help you achieve or maintain a healthy weight. Get regular exercise Get regular exercise. This is one of the most important things you can do for your health. Most adults should:  Exercise for at least 150 minutes each week. The exercise should increase your heart rate and make you sweat (moderate-intensity exercise).  Do  strengthening exercises at least twice a week. This is in addition to the moderate-intensity exercise.  Spend less time sitting. Even light physical activity can be beneficial. Watch cholesterol and blood lipids Have your blood tested for lipids and cholesterol at 36 years of age, then have this test every 5 years. You may need to have your cholesterol levels checked more often if:  Your lipid or cholesterol levels are high.  You are older than 36 years of age.  You are at high risk for heart disease. What should I know about cancer screening? Many types of cancers can be detected early and may often be prevented. Depending on your health history and family history, you may need to have cancer screening at various ages. This may include screening for:  Colorectal cancer.  Prostate cancer.  Skin cancer.  Lung cancer. What should I know about heart disease, diabetes, and high blood pressure? Blood pressure and heart disease  High blood pressure causes heart disease and increases the risk of stroke. This is more likely to develop in people who have high blood pressure readings, are of African descent, or are overweight.  Talk with your health care provider about your target blood pressure readings.  Have your blood pressure checked: ? Every 3-5 years if you are 35-12 years of age. ? Every year if you are 81 years old or older.  If you are between the ages of 36 and 69 and are a current or former smoker, ask your health care provider if you should have a one-time screening for abdominal aortic aneurysm (AAA). Diabetes Have regular diabetes screenings. This checks your fasting blood sugar level. Have the screening done:  Once every three years after age 74 if you are at a normal weight and have a low risk for diabetes.  More often and at a younger age if you are overweight or have a high risk for diabetes. What should I know about preventing infection? Hepatitis B If you have a  higher risk for hepatitis B, you should be screened for this virus. Talk with your health care provider to find out if you are at risk for hepatitis B infection. Hepatitis C Blood testing is recommended for:  Everyone born from [birth year] through 1963.  Anyone with known risk factors for hepatitis C. Sexually transmitted infections (STIs)  You should be screened each year for STIs, including gonorrhea and chlamydia, if: ? You are sexually active and are younger than 36 years of age. ? You are older than 36 years of age and your health care provider tells you that you are at risk for this type of infection. ? Your sexual activity has changed since you were last screened, and you are  at increased risk for chlamydia or gonorrhea. Ask your health care provider if you are at risk.  Ask your health care provider about whether you are at high risk for HIV. Your health care provider may recommend a prescription medicine to help prevent HIV infection. If you choose to take medicine to prevent HIV, you should first get tested for HIV. You should then be tested every 3 months for as long as you are taking the medicine. Follow these instructions at home: Lifestyle  Do not use any products that contain nicotine or tobacco, such as cigarettes, e-cigarettes, and chewing tobacco. If you need help quitting, ask your health care provider.  Do not use street drugs.  Do not share needles.  Ask your health care provider for help if you need support or information about quitting drugs. Alcohol use  Do not drink alcohol if your health care provider tells you not to drink.  If you drink alcohol: ? Limit how much you have to 0-2 drinks a day. ? Be aware of how much alcohol is in your drink. In the [place] one drink equals one 12 oz bottle of beer (355 mL), one 5 oz glass of wine (148 mL), or one 1 oz glass of hard liquor (44 mL). General instructions  Schedule regular health, dental, and eye exams.  Stay current  with your vaccines.  Tell your health care provider if: ? You often feel depressed. ? You have ever been abused or do not feel safe at home. Summary  Adopting a healthy lifestyle and getting preventive care are important in promoting health and wellness.  Follow your health care provider's instructions about healthy diet, exercising, and getting tested or screened for diseases.  Follow your health care provider's instructions on monitoring your cholesterol and blood pressure. This information is not intended to replace advice given to you by your health care provider. Make sure you discuss any questions you have with your health care provider. Document Released: 07/22/2007 Document Revised: 01/16/2018 Document Reviewed: 01/16/2018 Elsevier Patient Education  2020 Elsevier Inc.      Agustina Caroli, MD [facility]

## 2018-08-27 ENCOUNTER — Other Ambulatory Visit: Payer: Self-pay | Admitting: Emergency Medicine

## 2018-08-27 ENCOUNTER — Encounter: Payer: Self-pay | Admitting: Emergency Medicine

## 2018-08-27 DIAGNOSIS — E785 Hyperlipidemia, unspecified: Secondary | ICD-10-CM

## 2018-08-27 LAB — LIPID PANEL
Chol/HDL Ratio: 4.3 ratio (ref 0.0–5.0)
Cholesterol, Total: 234 mg/dL — ABNORMAL HIGH (ref 100–199)
HDL: 54 mg/dL (ref >39)
LDL Calculated: 144 mg/dL — ABNORMAL HIGH (ref 0–99)
Triglycerides: 180 mg/dL — ABNORMAL HIGH (ref 0–149)
VLDL Cholesterol Cal: 36 mg/dL (ref 5–40)

## 2018-08-27 LAB — CMP14+EGFR
ALT: 42 IU/L (ref 0–44)
AST: 32 IU/L (ref 0–40)
Albumin/Globulin Ratio: 2.3 — ABNORMAL HIGH (ref 1.2–2.2)
Albumin: 5 g/dL (ref 4.0–5.0)
Alkaline Phosphatase: 34 IU/L — ABNORMAL LOW (ref 39–117)
BUN/Creatinine Ratio: 8 — ABNORMAL LOW (ref 9–20)
BUN: 10 mg/dL (ref 6–20)
Bilirubin Total: 0.6 mg/dL (ref 0.0–1.2)
CO2: 22 mmol/L (ref 20–29)
Calcium: 9.8 mg/dL (ref 8.7–10.2)
Chloride: 100 mmol/L (ref 96–106)
Creatinine, Ser: 1.21 mg/dL (ref 0.76–1.27)
GFR calc Af Amer: 89 mL/min/{1.73_m2} (ref >59)
GFR calc non Af Amer: 77 mL/min/{1.73_m2} (ref >59)
Globulin, Total: 2.2 g/dL (ref 1.5–4.5)
Glucose: 101 mg/dL — ABNORMAL HIGH (ref 65–99)
Potassium: 4.4 mmol/L (ref 3.5–5.2)
Sodium: 139 mmol/L (ref 134–144)
Total Protein: 7.2 g/dL (ref 6.0–8.5)

## 2018-08-27 LAB — CBC
Hematocrit: 48 % (ref 37.5–51.0)
Hemoglobin: 16.5 g/dL (ref 13.0–17.7)
MCH: 30.4 pg (ref 26.6–33.0)
MCHC: 34.4 g/dL (ref 31.5–35.7)
MCV: 89 fL (ref 79–97)
Platelets: 256 10*3/uL (ref 150–450)
RBC: 5.42 x10E6/uL (ref 4.14–5.80)
RDW: 12.9 % (ref 11.6–15.4)
WBC: 3.6 10*3/uL (ref 3.4–10.8)

## 2018-08-27 LAB — PSA: Prostate Specific Ag, Serum: 0.6 ng/mL (ref 0.0–4.0)

## 2018-08-27 LAB — TSH: TSH: 2.45 u[IU]/mL (ref 0.450–4.500)

## 2018-08-27 MED ORDER — ROSUVASTATIN CALCIUM 10 MG PO TABS: 10.0000 mg | ORAL_TABLET | Freq: Every day | ORAL | 3 refills | Status: DC

## 2019-05-15 ENCOUNTER — Ambulatory Visit: Payer: Managed Care, Other (non HMO)

## 2019-05-22 ENCOUNTER — Ambulatory Visit: Payer: Managed Care, Other (non HMO) | Attending: Internal Medicine

## 2019-05-22 DIAGNOSIS — Z23 Encounter for immunization: Secondary | ICD-10-CM

## 2019-05-22 NOTE — Progress Notes (Signed)
   Covid-19 Vaccination Clinic  Name:  Frank Galvan    MRN: [id number] DOB: [birth year]  05/22/2019  [name] was observed post Covid-19 immunization for 15 minutes without incident. He was provided with Vaccine Information Sheet and instruction to access the V-Safe system.   [name] was instructed to call 911 with any severe reactions post vaccine: Marland Kitchen Difficulty breathing  . Swelling of face and throat  . A fast heartbeat  . A bad rash all over body  . Dizziness and weakness   Immunizations Administered    Name Date Dose VIS Date Route   Pfizer COVID-19 Vaccine 05/22/2019  3:26 PM 0.3 mL 01/17/2019 Intramuscular   Manufacturer: [place]   Lot: [id number]   [place]: [id number]

## 2019-06-16 ENCOUNTER — Ambulatory Visit: Payer: Managed Care, Other (non HMO) | Attending: Internal Medicine

## 2019-06-16 DIAGNOSIS — Z23 Encounter for immunization: Secondary | ICD-10-CM

## 2019-06-16 NOTE — Progress Notes (Signed)
   Covid-19 Vaccination Clinic  Name:  Ollivander Laduke    MRN: [id number] DOB: 11/22/1980  06/16/2019  Mr. Berretta was observed post Covid-19 immunization for 15 minutes without incident. He was provided with Vaccine Information Sheet and instruction to access the V-Safe system.   Mr. Sipp was instructed to call 911 with any severe reactions post vaccine: Marland Kitchen Difficulty breathing  . Swelling of face and throat  . A fast heartbeat  . A bad rash all over body  . Dizziness and weakness   Immunizations Administered    Name Date Dose VIS Date Route   Pfizer COVID-19 Vaccine 06/16/2019  8:40 AM 0.3 mL 04/02/2018 Intramuscular   Manufacturer: [place]   Lot: [id number]   [place]: [id number]

## 2020-01-15 ENCOUNTER — Ambulatory Visit (INDEPENDENT_AMBULATORY_CARE_PROVIDER_SITE_OTHER): Payer: Managed Care, Other (non HMO) | Admitting: Emergency Medicine

## 2020-01-15 ENCOUNTER — Other Ambulatory Visit: Payer: Self-pay

## 2020-01-15 ENCOUNTER — Encounter: Payer: Self-pay | Admitting: Emergency Medicine

## 2020-01-15 VITALS — BP 130/78 | HR 71 | Temp 98.3°F | Resp 16 | Ht 72.0 in | Wt 192.0 lb

## 2020-01-15 DIAGNOSIS — Z13228 Encounter for screening for other metabolic disorders: Secondary | ICD-10-CM

## 2020-01-15 DIAGNOSIS — Z13220 Encounter for screening for lipoid disorders: Secondary | ICD-10-CM | POA: Diagnosis not present

## 2020-01-15 DIAGNOSIS — Z1329 Encounter for screening for other suspected endocrine disorder: Secondary | ICD-10-CM | POA: Diagnosis not present

## 2020-01-15 DIAGNOSIS — Z0000 Encounter for general adult medical examination without abnormal findings: Secondary | ICD-10-CM | POA: Diagnosis not present

## 2020-01-15 DIAGNOSIS — Z130 Encounter for screening for diseases of the blood and blood-forming organs and certain disorders involving the immune mechanism: Secondary | ICD-10-CM | POA: Diagnosis not present

## 2020-01-15 NOTE — Patient Instructions (Addendum)
   If you have lab work done today you will be contacted with your lab results within the next 2 weeks.  If you have not heard from us then please contact us. The fastest way to get your results is to register for My Chart.   IF you received an x-ray today, you will receive an invoice from [facility]. Please contact [facility] at [phone] with questions or concerns regarding your invoice.   IF you received labwork today, you will receive an invoice from [facility]. Please contact [facility] at [phone] with questions or concerns regarding your invoice.   Our billing staff will not be able to assist you with questions regarding bills from these companies.  You will be contacted with the lab results as soon as they are available. The fastest way to get your results is to activate your My Chart account. Instructions are located on the last page of this paperwork. If you have not heard from us regarding the results in 2 weeks, please contact this office.      Health Maintenance, Male Adopting a healthy lifestyle and getting preventive care are important in promoting health and wellness. Ask your health care provider about:  The right schedule for you to have regular tests and exams.  Things you can do on your own to prevent diseases and keep yourself healthy. What should I know about diet, weight, and exercise? Eat a healthy diet   Eat a diet that includes plenty of vegetables, fruits, low-fat dairy products, and lean protein.  Do not eat a lot of foods that are high in solid fats, added sugars, or sodium. Maintain a healthy weight Body mass index (BMI) is a measurement that can be used to identify possible weight problems. It estimates body fat based on height and weight. Your health care provider can help determine your BMI and help you achieve or maintain a healthy weight. Get regular exercise Get regular exercise. This is one of the most important things you  can do for your health. Most adults should:  Exercise for at least 150 minutes each week. The exercise should increase your heart rate and make you sweat (moderate-intensity exercise).  Do strengthening exercises at least twice a week. This is in addition to the moderate-intensity exercise.  Spend less time sitting. Even light physical activity can be beneficial. Watch cholesterol and blood lipids Have your blood tested for lipids and cholesterol at 37 years of age, then have this test every 5 years. You may need to have your cholesterol levels checked more often if:  Your lipid or cholesterol levels are high.  You are older than 37 years of age.  You are at high risk for heart disease. What should I know about cancer screening? Many types of cancers can be detected early and may often be prevented. Depending on your health history and family history, you may need to have cancer screening at various ages. This may include screening for:  Colorectal cancer.  Prostate cancer.  Skin cancer.  Lung cancer. What should I know about heart disease, diabetes, and high blood pressure? Blood pressure and heart disease  High blood pressure causes heart disease and increases the risk of stroke. This is more likely to develop in people who have high blood pressure readings, are of African descent, or are overweight.  Talk with your health care provider about your target blood pressure readings.  Have your blood pressure checked: ? Every 3-5 years if you are 18-39   years of age. ? Every year if you are 40 years old or older.  If you are between the ages of 65 and 75 and are a current or former smoker, ask your health care provider if you should have a one-time screening for abdominal aortic aneurysm (AAA). Diabetes Have regular diabetes screenings. This checks your fasting blood sugar level. Have the screening done:  Once every three years after age 45 if you are at a normal weight and have  a low risk for diabetes.  More often and at a younger age if you are overweight or have a high risk for diabetes. What should I know about preventing infection? Hepatitis B If you have a higher risk for hepatitis B, you should be screened for this virus. Talk with your health care provider to find out if you are at risk for hepatitis B infection. Hepatitis C Blood testing is recommended for:  Everyone born from 1945 through 1965.  Anyone with known risk factors for hepatitis C. Sexually transmitted infections (STIs)  You should be screened each year for STIs, including gonorrhea and chlamydia, if: ? You are sexually active and are younger than 37 years of age. ? You are older than 37 years of age and your health care provider tells you that you are at risk for this type of infection. ? Your sexual activity has changed since you were last screened, and you are at increased risk for chlamydia or gonorrhea. Ask your health care provider if you are at risk.  Ask your health care provider about whether you are at high risk for HIV. Your health care provider may recommend a prescription medicine to help prevent HIV infection. If you choose to take medicine to prevent HIV, you should first get tested for HIV. You should then be tested every 3 months for as long as you are taking the medicine. Follow these instructions at home: Lifestyle  Do not use any products that contain nicotine or tobacco, such as cigarettes, e-cigarettes, and chewing tobacco. If you need help quitting, ask your health care provider.  Do not use street drugs.  Do not share needles.  Ask your health care provider for help if you need support or information about quitting drugs. Alcohol use  Do not drink alcohol if your health care provider tells you not to drink.  If you drink alcohol: ? Limit how much you have to 0-2 drinks a day. ? Be aware of how much alcohol is in your drink. In the [place] one drink equals one 12  oz bottle of beer (355 mL), one 5 oz glass of wine (148 mL), or one 1 oz glass of hard liquor (44 mL). General instructions  Schedule regular health, dental, and eye exams.  Stay current with your vaccines.  Tell your health care provider if: ? You often feel depressed. ? You have ever been abused or do not feel safe at home. Summary  Adopting a healthy lifestyle and getting preventive care are important in promoting health and wellness.  Follow your health care provider's instructions about healthy diet, exercising, and getting tested or screened for diseases.  Follow your health care provider's instructions on monitoring your cholesterol and blood pressure. This information is not intended to replace advice given to you by your health care provider. Make sure you discuss any questions you have with your health care provider. Document Revised: 01/16/2018 Document Reviewed: 01/16/2018 Elsevier Patient Education  2020 Elsevier Inc.  

## 2020-01-15 NOTE — Progress Notes (Signed)
Frank Galvan 39 y.o.   Chief Complaint  Patient presents with  . Annual Exam    HISTORY OF PRESENT ILLNESS: This is a 39 y.o. male here for annual exam. Chronic smoker. History of dyslipidemia but not taking rosuvastatin. Lab Results  Component Value Date   CHOL 234 (H) 08/26/2018   HDL 54 08/26/2018   LDLCALC 144 (H) 08/26/2018   TRIG 180 (H) 08/26/2018   CHOLHDL 4.3 08/26/2018  Fully vaccinated against Covid. Has no complaints or medical concerns today.   HPI   Prior to Admission medications   Medication Sig Start Date End Date Taking? Authorizing Provider  famotidine (PEPCID) 20 MG tablet Take 20 mg by mouth 2 (two) times daily.   Yes [provider]  omeprazole (PRILOSEC) 20 MG capsule Take 20 mg by mouth daily.   Yes [provider]  rosuvastatin (CRESTOR) 10 MG tablet Take 1 tablet (10 mg total) by mouth daily. Patient not taking: Reported on 01/15/2020 08/27/18   Horald Pollen, MD    No Known Allergies  Patient Active Problem List   Diagnosis Date Noted  . Elevated triglycerides with high cholesterol 05/07/2015    Past Medical History:  Diagnosis Date  . Acid reflux   . GERD (gastroesophageal reflux disease)    [employer] 01/13/2020  . Perianal abscess     Past Surgical History:  Procedure Laterality Date  . NO PAST SURGERIES      Social History   Socioeconomic History  . Marital status: Single    Spouse name: Not on file  . Number of children: 0  . Years of education: Not on file  . Highest education level: Not on file  Occupational History  . Occupation: Engineer, production  Tobacco Use  . Smoking status: Current Every Day Smoker    Packs/day: 0.12    Years: 16.00    Pack years: 1.92    Types: Cigarettes  . Smokeless tobacco: Former Systems developer    Types: Chew  Substance and Sexual Activity  . Alcohol use: Yes    Alcohol/week: 0.0 standard drinks    Comment: 6 beers a day  . Drug use: Yes    Types: Marijuana    Comment:  rare social  . Sexual activity: Yes    Partners: Female  Other Topics Concern  . Not on file  Social History Narrative   Single   No children   Engineer, site   No current exercise   Social Determinants of Health   Financial Resource Strain: Not on file  Food Insecurity: Not on file  Transportation Needs: Not on file  Physical Activity: Not on file  Stress: Not on file  Social Connections: Not on file  Intimate Partner Violence: Not on file    Family History  Problem Relation Age of Onset  . Hypertension Mother   . Prostate cancer Father   . Stroke Maternal Grandfather   . Ovarian cancer Paternal Grandmother   . Prostate cancer Paternal Grandfather      Review of Systems  Constitutional: Negative.  Negative for chills and fever.  HENT: Negative.  Negative for congestion and sore throat.   Eyes: Negative.   Respiratory: Negative.  Negative for cough and shortness of breath.   Cardiovascular: Negative.  Negative for chest pain and palpitations.  Gastrointestinal: Negative.  Negative for abdominal pain, blood in stool, diarrhea, melena, nausea and vomiting.  Genitourinary: Negative.  Negative for dysuria and hematuria.  Musculoskeletal: Negative.   Skin: Negative.  Negative for rash.  Neurological: Negative.  Negative for dizziness and headaches.  All other systems reviewed and are negative.    Today's Vitals   01/15/20 1415  BP: 130/78  Pulse: 71  Resp: 16  Temp: 98.3 F (36.8 C)  TempSrc: Temporal  SpO2: 99%  Weight: 192 lb (87.1 kg)  Height: 6' (1.829 m)   Body mass index is 26.04 kg/m.   Physical Exam Vitals reviewed.  Constitutional:      Appearance: Normal appearance.  HENT:     Head: Normocephalic.  Eyes:     Extraocular Movements: Extraocular movements intact.     Conjunctiva/sclera: Conjunctivae normal.     Pupils: Pupils are equal, round, and reactive to light.  Neck:     Vascular: No carotid bruit.  Cardiovascular:     Rate and  Rhythm: Normal rate and regular rhythm.     Pulses: Normal pulses.     Heart sounds: Normal heart sounds.  Pulmonary:     Effort: Pulmonary effort is normal.     Breath sounds: Normal breath sounds.  Abdominal:     General: Bowel sounds are normal. There is no distension.     Palpations: Abdomen is soft. There is no mass.     Tenderness: There is no abdominal tenderness.  Musculoskeletal:        General: Normal range of motion.     Cervical back: Normal range of motion and neck supple. No tenderness.     Right lower leg: No edema.     Left lower leg: No edema.  Lymphadenopathy:     Cervical: No cervical adenopathy.  Skin:    General: Skin is warm and dry.     Capillary Refill: Capillary refill takes less than 2 seconds.  Neurological:     General: No focal deficit present.     Mental Status: He is alert and oriented to person, place, and time.  Psychiatric:        Mood and Affect: Mood normal.        Behavior: Behavior normal.      ASSESSMENT & PLAN: [name] was seen today for annual exam.  Diagnoses and all orders for this visit:  Routine general medical examination at a health care facility  Screening for deficiency anemia -     CBC with Differential/Platelet; Future  Screening for lipoid disorders -     Lipid panel; Future  Screening for endocrine, metabolic and immunity disorder -     Comprehensive metabolic panel; Future -     PSA; Future    Patient Instructions       If you have lab work done today you will be contacted with your lab results within the next 2 weeks.  If you have not heard from [place] then please contact us. The fastest way to get your results is to register for My Chart.   IF you received an x-ray today, you will receive an invoice from [facility]. Please contact [facility] at [phone] with questions or concerns regarding your invoice.   IF you received labwork today, you will receive an invoice from [facility]. Please  contact [facility] at [phone] with questions or concerns regarding your invoice.   Our billing staff will not be able to assist you with questions regarding bills from these companies.  You will be contacted with the lab results as soon as they are available. The fastest way to get your results is to activate your My Chart account. Instructions are  located on the last page of this paperwork. If you have not heard from [place] regarding the results in 2 weeks, please contact this office.      Health Maintenance, Male Adopting a healthy lifestyle and getting preventive care are important in promoting health and wellness. Ask your health care provider about:  The right schedule for you to have regular tests and exams.  Things you can do on your own to prevent diseases and keep yourself healthy. What should I know about diet, weight, and exercise? Eat a healthy diet   Eat a diet that includes plenty of vegetables, fruits, low-fat dairy products, and lean protein.  Do not eat a lot of foods that are high in solid fats, added sugars, or sodium. Maintain a healthy weight Body mass index (BMI) is a measurement that can be used to identify possible weight problems. It estimates body fat based on height and weight. Your health care provider can help determine your BMI and help you achieve or maintain a healthy weight. Get regular exercise Get regular exercise. This is one of the most important things you can do for your health. Most adults should:  Exercise for at least 150 minutes each week. The exercise should increase your heart rate and make you sweat (moderate-intensity exercise).  Do strengthening exercises at least twice a week. This is in addition to the moderate-intensity exercise.  Spend less time sitting. Even light physical activity can be beneficial. Watch cholesterol and blood lipids Have your blood tested for lipids and cholesterol at 37 years of age, then have this test every 5  years. You may need to have your cholesterol levels checked more often if:  Your lipid or cholesterol levels are high.  You are older than 37 years of age.  You are at high risk for heart disease. What should I know about cancer screening? Many types of cancers can be detected early and may often be prevented. Depending on your health history and family history, you may need to have cancer screening at various ages. This may include screening for:  Colorectal cancer.  Prostate cancer.  Skin cancer.  Lung cancer. What should I know about heart disease, diabetes, and high blood pressure? Blood pressure and heart disease  High blood pressure causes heart disease and increases the risk of stroke. This is more likely to develop in people who have high blood pressure readings, are of African descent, or are overweight.  Talk with your health care provider about your target blood pressure readings.  Have your blood pressure checked: ? Every 3-5 years if you are 63-65 years of age. ? Every year if you are 57 years old or older.  If you are between the ages of 69 and 25 and are a current or former smoker, ask your health care provider if you should have a one-time screening for abdominal aortic aneurysm (AAA). Diabetes Have regular diabetes screenings. This checks your fasting blood sugar level. Have the screening done:  Once every three years after age 29 if you are at a normal weight and have a low risk for diabetes.  More often and at a younger age if you are overweight or have a high risk for diabetes. What should I know about preventing infection? Hepatitis B If you have a higher risk for hepatitis B, you should be screened for this virus. Talk with your health care provider to find out if you are at risk for hepatitis B infection. Hepatitis C Blood testing  is recommended for:  Everyone born from [birth year] through 1965.  Anyone with known risk factors for hepatitis C. Sexually  transmitted infections (STIs)  You should be screened each year for STIs, including gonorrhea and chlamydia, if: ? You are sexually active and are younger than 36 years of age. ? You are older than 37 years of age and your health care provider tells you that you are at risk for this type of infection. ? Your sexual activity has changed since you were last screened, and you are at increased risk for chlamydia or gonorrhea. Ask your health care provider if you are at risk.  Ask your health care provider about whether you are at high risk for HIV. Your health care provider may recommend a prescription medicine to help prevent HIV infection. If you choose to take medicine to prevent HIV, you should first get tested for HIV. You should then be tested every 3 months for as long as you are taking the medicine. Follow these instructions at home: Lifestyle  Do not use any products that contain nicotine or tobacco, such as cigarettes, e-cigarettes, and chewing tobacco. If you need help quitting, ask your health care provider.  Do not use street drugs.  Do not share needles.  Ask your health care provider for help if you need support or information about quitting drugs. Alcohol use  Do not drink alcohol if your health care provider tells you not to drink.  If you drink alcohol: ? Limit how much you have to 0-2 drinks a day. ? Be aware of how much alcohol is in your drink. In the [place] one drink equals one 12 oz bottle of beer (355 mL), one 5 oz glass of wine (148 mL), or one 1 oz glass of hard liquor (44 mL). General instructions  Schedule regular health, dental, and eye exams.  Stay current with your vaccines.  Tell your health care provider if: ? You often feel depressed. ? You have ever been abused or do not feel safe at home. Summary  Adopting a healthy lifestyle and getting preventive care are important in promoting health and wellness.  Follow your health care provider's  instructions about healthy diet, exercising, and getting tested or screened for diseases.  Follow your health care provider's instructions on monitoring your cholesterol and blood pressure. This information is not intended to replace advice given to you by your health care provider. Make sure you discuss any questions you have with your health care provider. Document Revised: 01/16/2018 Document Reviewed: 01/16/2018 Elsevier Patient Education  2020 Elsevier Inc.      Agustina Caroli, MD [facility]

## 2020-01-20 ENCOUNTER — Ambulatory Visit (INDEPENDENT_AMBULATORY_CARE_PROVIDER_SITE_OTHER): Payer: Managed Care, Other (non HMO) | Admitting: Emergency Medicine

## 2020-01-20 ENCOUNTER — Other Ambulatory Visit: Payer: Self-pay

## 2020-01-20 DIAGNOSIS — Z13228 Encounter for screening for other metabolic disorders: Secondary | ICD-10-CM | POA: Diagnosis not present

## 2020-01-20 DIAGNOSIS — Z1329 Encounter for screening for other suspected endocrine disorder: Secondary | ICD-10-CM

## 2020-01-20 DIAGNOSIS — Z130 Encounter for screening for diseases of the blood and blood-forming organs and certain disorders involving the immune mechanism: Secondary | ICD-10-CM | POA: Diagnosis not present

## 2020-01-20 DIAGNOSIS — Z13220 Encounter for screening for lipoid disorders: Secondary | ICD-10-CM

## 2020-01-21 ENCOUNTER — Other Ambulatory Visit: Payer: Self-pay | Admitting: Emergency Medicine

## 2020-01-21 DIAGNOSIS — E785 Hyperlipidemia, unspecified: Secondary | ICD-10-CM

## 2020-01-21 LAB — CBC WITH DIFFERENTIAL/PLATELET
Basophils Absolute: 0.1 10*3/uL (ref 0.0–0.2)
Basos: 1 %
EOS (ABSOLUTE): 0.1 10*3/uL (ref 0.0–0.4)
Eos: 2 %
Hematocrit: 48.8 % (ref 37.5–51.0)
Hemoglobin: 16.7 g/dL (ref 13.0–17.7)
Immature Grans (Abs): 0 10*3/uL (ref 0.0–0.1)
Immature Granulocytes: 0 %
Lymphocytes Absolute: 1.6 10*3/uL (ref 0.7–3.1)
Lymphs: 39 %
MCH: 30.3 pg (ref 26.6–33.0)
MCHC: 34.2 g/dL (ref 31.5–35.7)
MCV: 89 fL (ref 79–97)
Monocytes Absolute: 0.6 10*3/uL (ref 0.1–0.9)
Monocytes: 13 %
Neutrophils Absolute: 1.9 10*3/uL (ref 1.4–7.0)
Neutrophils: 45 %
Platelets: 276 10*3/uL (ref 150–450)
RBC: 5.51 x10E6/uL (ref 4.14–5.80)
RDW: 12.7 % (ref 11.6–15.4)
WBC: 4.2 10*3/uL (ref 3.4–10.8)

## 2020-01-21 LAB — COMPREHENSIVE METABOLIC PANEL
ALT: 42 IU/L (ref 0–44)
AST: 25 IU/L (ref 0–40)
Albumin/Globulin Ratio: 2.2 (ref 1.2–2.2)
Albumin: 4.9 g/dL (ref 4.0–5.0)
Alkaline Phosphatase: 35 IU/L — ABNORMAL LOW (ref 44–121)
BUN/Creatinine Ratio: 11 (ref 9–20)
BUN: 13 mg/dL (ref 6–20)
Bilirubin Total: 0.4 mg/dL (ref 0.0–1.2)
CO2: 24 mmol/L (ref 20–29)
Calcium: 10 mg/dL (ref 8.7–10.2)
Chloride: 100 mmol/L (ref 96–106)
Creatinine, Ser: 1.17 mg/dL (ref 0.76–1.27)
GFR calc Af Amer: 92 mL/min/{1.73_m2} (ref >59)
GFR calc non Af Amer: 79 mL/min/{1.73_m2} (ref >59)
Globulin, Total: 2.2 g/dL (ref 1.5–4.5)
Glucose: 101 mg/dL — ABNORMAL HIGH (ref 65–99)
Potassium: 4.9 mmol/L (ref 3.5–5.2)
Sodium: 139 mmol/L (ref 134–144)
Total Protein: 7.1 g/dL (ref 6.0–8.5)

## 2020-01-21 LAB — LIPID PANEL
Chol/HDL Ratio: 5 ratio (ref 0.0–5.0)
Cholesterol, Total: 238 mg/dL — ABNORMAL HIGH (ref 100–199)
HDL: 48 mg/dL (ref >39)
LDL Chol Calc (NIH): 141 mg/dL — ABNORMAL HIGH (ref 0–99)
Triglycerides: 269 mg/dL — ABNORMAL HIGH (ref 0–149)
VLDL Cholesterol Cal: 49 mg/dL — ABNORMAL HIGH (ref 5–40)

## 2020-01-21 LAB — PSA: Prostate Specific Ag, Serum: 0.8 ng/mL (ref 0.0–4.0)

## 2020-01-21 MED ORDER — ROSUVASTATIN CALCIUM 10 MG PO TABS: 10.0000 mg | ORAL_TABLET | Freq: Every day | ORAL | 3 refills | Status: DC

## 2020-07-30 ENCOUNTER — Other Ambulatory Visit: Payer: Self-pay

## 2020-08-02 ENCOUNTER — Other Ambulatory Visit: Payer: Self-pay

## 2020-08-02 ENCOUNTER — Ambulatory Visit (INDEPENDENT_AMBULATORY_CARE_PROVIDER_SITE_OTHER): Payer: Managed Care, Other (non HMO) | Admitting: Emergency Medicine

## 2020-08-02 ENCOUNTER — Encounter: Payer: Self-pay | Admitting: Emergency Medicine

## 2020-08-02 VITALS — BP 106/80 | HR 66 | Temp 98.0°F | Ht 72.0 in | Wt 190.2 lb

## 2020-08-02 DIAGNOSIS — K6289 Other specified diseases of anus and rectum: Secondary | ICD-10-CM | POA: Diagnosis not present

## 2020-08-02 MED ORDER — CEFADROXIL 500 MG PO CAPS: 500.0000 mg | ORAL_CAPSULE | Freq: Two times a day (BID) | ORAL | 0 refills | Status: AC

## 2020-08-02 NOTE — Assessment & Plan Note (Signed)
No obvious abscess on examination.  Given past medical history this could be an early presentation.  Advised to take cefadroxil 500 mg twice a day for 5 days and follow-up with Dr. Ardis Hughs, gastrointestinal doctor who saw him in 2020 for reevaluation and colonoscopy.

## 2020-08-02 NOTE — Progress Notes (Signed)
Frank Galvan 40 y.o.   Chief Complaint  Patient presents with   RECTUM PROBLEM    Per patient abcess near rectum x 1-2 weeks    HISTORY OF PRESENT ILLNESS: This is a 40 y.o. male complaining of possible perianal abscess.  Feels small lump to right perianal area.  Not tender.  No drainage. Has similar episode in 2020 which turned out to be a perianal abscess. Went to see GI doctor on 04/2018 and advised to get colonoscopy but he never scheduled it due to pandemic. No other complaints or medical concerns today.  HPI   Prior to Admission medications   Medication Sig Start Date End Date Taking? Authorizing Provider  omeprazole (PRILOSEC) 20 MG capsule Take 20 mg by mouth daily.   Yes [provider]  rosuvastatin (CRESTOR) 10 MG tablet Take 1 tablet (10 mg total) by mouth daily. 01/21/20  Yes Nettye Flegal, Ines Bloomer, MD  famotidine (PEPCID) 20 MG tablet Take 20 mg by mouth 2 (two) times daily. Patient not taking: Reported on 08/02/2020    [provider]    Not on File  Patient Active Problem List   Diagnosis Date Noted   Elevated triglycerides with high cholesterol 05/07/2015    Past Medical History:  Diagnosis Date   Acid reflux    GERD (gastroesophageal reflux disease)    Phreesia 01/13/2020   Perianal abscess     Past Surgical History:  Procedure Laterality Date   NO PAST SURGERIES      Social History   Socioeconomic History   Marital status: Single    Spouse name: Not on file   Number of children: 0   Years of education: Not on file   Highest education level: Not on file  Occupational History   Occupation: Engineering  Tobacco Use   Smoking status: Every Day    Packs/day: 0.12    Years: 16.00    Pack years: 1.92    Types: Cigarettes   Smokeless tobacco: Former    Types: Chew  Substance and Sexual Activity   Alcohol use: Yes    Alcohol/week: 0.0 standard drinks    Comment: 6 beers a day   Drug use: Yes    Types: Marijuana     Comment: rare social   Sexual activity: Yes    Partners: Female  Other Topics Concern   Not on file  Social History Narrative   Single   No children   Engineer, site   No current exercise   Social Determinants of Health   Financial Resource Strain: Not on file  Food Insecurity: Not on file  Transportation Needs: Not on file  Physical Activity: Not on file  Stress: Not on file  Social Connections: Not on file  Intimate Partner Violence: Not on file    Family History  Problem Relation Age of Onset   Hypertension Mother    Prostate cancer Father    Stroke Maternal Grandfather    Ovarian cancer Paternal Grandmother    Prostate cancer Paternal Grandfather      Review of Systems  Constitutional: Negative.  Negative for chills and fever.  All other systems reviewed and are negative.  Today's Vitals   08/02/20 0805  BP: 106/80  Pulse: 66  Temp: 98 F (36.7 C)  TempSrc: Oral  SpO2: 98%  Weight: 190 lb 3.2 oz (86.3 kg)  Height: 6' (1.829 m)   Body mass index is 25.8 kg/m.  Physical Exam Vitals reviewed.  Constitutional:  Appearance: Normal appearance.  HENT:     Head: Normocephalic.  Eyes:     Extraocular Movements: Extraocular movements intact.     Pupils: Pupils are equal, round, and reactive to light.  Cardiovascular:     Rate and Rhythm: Normal rate and regular rhythm.     Pulses: Normal pulses.     Heart sounds: Normal heart sounds.  Pulmonary:     Effort: Pulmonary effort is normal.     Breath sounds: Normal breath sounds.  Abdominal:     General: Bowel sounds are normal. There is no distension.     Palpations: Abdomen is soft.     Tenderness: There is no abdominal tenderness.     Hernia: There is no hernia in the left inguinal area or right inguinal area.     Comments: Perianal area: No erythema or masses felt.  No lumps.  No signs of perianal abscess. Normal perineal area  Genitourinary:    Penis: Circumcised. No tenderness or  swelling.      Testes: Normal.     Epididymis:     Right: Normal.     Left: Normal.     Rectum: External hemorrhoid present.  Musculoskeletal:        General: Normal range of motion.     Cervical back: Normal range of motion and neck supple.  Lymphadenopathy:     Lower Body: No right inguinal adenopathy. No left inguinal adenopathy.  Skin:    General: Skin is warm and dry.  Neurological:     General: No focal deficit present.     Mental Status: He is alert and oriented to person, place, and time.  Psychiatric:        Mood and Affect: Mood normal.        Behavior: Behavior normal.     ASSESSMENT & PLAN: Anal pain No obvious abscess on examination.  Given past medical history this could be an early presentation.  Advised to take cefadroxil 500 mg twice a day for 5 days and follow-up with Dr. Ardis Hughs, gastrointestinal doctor who saw him in 2020 for reevaluation and colonoscopy. Frank Galvan was seen today for rectum problem.  Diagnoses and all orders for this visit:  Anal pain Comments: Possible early abscess. Orders: -     cefadroxil (DURICEF) 500 MG capsule; Take 1 capsule (500 mg total) by mouth 2 (two) times daily for 5 days.  Patient Instructions  Follow-up with gastrointestinal doctor to schedule colonoscopy. Health Maintenance, Male Adopting a healthy lifestyle and getting preventive care are important in promoting health and wellness. Ask your health care provider about: The right schedule for you to have regular tests and exams. Things you can do on your own to prevent diseases and keep yourself healthy. What should I know about diet, weight, and exercise? Eat a healthy diet  Eat a diet that includes plenty of vegetables, fruits, low-fat dairy products, and lean protein. Do not eat a lot of foods that are high in solid fats, added sugars, or sodium.  Maintain a healthy weight Body mass index (BMI) is a measurement that can be used to identify possible weight problems. It  estimates body fat based on height and weight. Your health care provider can help determine your BMI and help you achieve or maintain ahealthy weight. Get regular exercise Get regular exercise. This is one of the most important things you can do for your health. Most adults should: Exercise for at least 150 minutes each week. The exercise should increase  your heart rate and make you sweat (moderate-intensity exercise). Do strengthening exercises at least twice a week. This is in addition to the moderate-intensity exercise. Spend less time sitting. Even light physical activity can be beneficial. Watch cholesterol and blood lipids Have your blood tested for lipids and cholesterol at 38 years of age, then havethis test every 5 years. You may need to have your cholesterol levels checked more often if: Your lipid or cholesterol levels are high. You are older than 38 years of age. You are at high risk for heart disease. What should I know about cancer screening? Many types of cancers can be detected early and may often be prevented. Depending on your health history and family history, you may need to have cancer screening at various ages. This may include screening for: Colorectal cancer. Prostate cancer. Skin cancer. Lung cancer. What should I know about heart disease, diabetes, and high blood pressure? Blood pressure and heart disease High blood pressure causes heart disease and increases the risk of stroke. This is more likely to develop in people who have high blood pressure readings, are of African descent, or are overweight. Talk with your health care provider about your target blood pressure readings. Have your blood pressure checked: Every 3-5 years if you are 94-107 years of age. Every year if you are 23 years old or older. If you are between the ages of 29 and 30 and are a current or former smoker, ask your health care provider if you should have a one-time screening for abdominal aortic  aneurysm (AAA). Diabetes Have regular diabetes screenings. This checks your fasting blood sugar level. Have the screening done: Once every three years after age 47 if you are at a normal weight and have a low risk for diabetes. More often and at a younger age if you are overweight or have a high risk for diabetes. What should I know about preventing infection? Hepatitis B If you have a higher risk for hepatitis B, you should be screened for this virus. Talk with your health care provider to find out if you are at risk forhepatitis B infection. Hepatitis C Blood testing is recommended for: Everyone born from [birth year] through 1965. Anyone with known risk factors for hepatitis C. Sexually transmitted infections (STIs) You should be screened each year for STIs, including gonorrhea and chlamydia, if: You are sexually active and are younger than 38 years of age. You are older than 38 years of age and your health care provider tells you that you are at risk for this type of infection. Your sexual activity has changed since you were last screened, and you are at increased risk for chlamydia or gonorrhea. Ask your health care provider if you are at risk. Ask your health care provider about whether you are at high risk for HIV. Your health care provider may recommend a prescription medicine to help prevent HIV infection. If you choose to take medicine to prevent HIV, you should first get tested for HIV. You should then be tested every 3 months for as long as you are taking the medicine. Follow these instructions at home: Lifestyle Do not use any products that contain nicotine or tobacco, such as cigarettes, e-cigarettes, and chewing tobacco. If you need help quitting, ask your health care provider. Do not use street drugs. Do not share needles. Ask your health care provider for help if you need support or information about quitting drugs. Alcohol use Do not drink alcohol if your health care provider  tells  you not to drink. If you drink alcohol: Limit how much you have to 0-2 drinks a day. Be aware of how much alcohol is in your drink. In the [place] one drink equals one 12 oz bottle of beer (355 mL), one 5 oz glass of wine (148 mL), or one 1 oz glass of hard liquor (44 mL). General instructions Schedule regular health, dental, and eye exams. Stay current with your vaccines. Tell your health care provider if: You often feel depressed. You have ever been abused or do not feel safe at home. Summary Adopting a healthy lifestyle and getting preventive care are important in promoting health and wellness. Follow your health care provider's instructions about healthy diet, exercising, and getting tested or screened for diseases. Follow your health care provider's instructions on monitoring your cholesterol and blood pressure. This information is not intended to replace advice given to you by your health care provider. Make sure you discuss any questions you have with your healthcare provider. Document Revised: 01/16/2018 Document Reviewed: 01/16/2018 Elsevier Patient Education  2022 [place], [place] [facility] [address] [facility]

## 2020-08-02 NOTE — Patient Instructions (Addendum)
Follow-up with gastrointestinal doctor to schedule colonoscopy. Health Maintenance, Male Adopting a healthy lifestyle and getting preventive care are important in promoting health and wellness. Ask your health care provider about: The right schedule for you to have regular tests and exams. Things you can do on your own to prevent diseases and keep yourself healthy. What should I know about diet, weight, and exercise? Eat a healthy diet  Eat a diet that includes plenty of vegetables, fruits, low-fat dairy products, and lean protein. Do not eat a lot of foods that are high in solid fats, added sugars, or sodium.  Maintain a healthy weight Body mass index (BMI) is a measurement that can be used to identify possible weight problems. It estimates body fat based on height and weight. Your health care provider can help determine your BMI and help you achieve or maintain ahealthy weight. Get regular exercise Get regular exercise. This is one of the most important things you can do for your health. Most adults should: Exercise for at least 150 minutes each week. The exercise should increase your heart rate and make you sweat (moderate-intensity exercise). Do strengthening exercises at least twice a week. This is in addition to the moderate-intensity exercise. Spend less time sitting. Even light physical activity can be beneficial. Watch cholesterol and blood lipids Have your blood tested for lipids and cholesterol at 38 years of age, then havethis test every 5 years. You may need to have your cholesterol levels checked more often if: Your lipid or cholesterol levels are high. You are older than 38 years of age. You are at high risk for heart disease. What should I know about cancer screening? Many types of cancers can be detected early and may often be prevented. Depending on your health history and family history, you may need to have cancer screening at various ages. This may include screening  for: Colorectal cancer. Prostate cancer. Skin cancer. Lung cancer. What should I know about heart disease, diabetes, and high blood pressure? Blood pressure and heart disease High blood pressure causes heart disease and increases the risk of stroke. This is more likely to develop in people who have high blood pressure readings, are of African descent, or are overweight. Talk with your health care provider about your target blood pressure readings. Have your blood pressure checked: Every 3-5 years if you are 56-45 years of age. Every year if you are 18 years old or older. If you are between the ages of 28 and 64 and are a current or former smoker, ask your health care provider if you should have a one-time screening for abdominal aortic aneurysm (AAA). Diabetes Have regular diabetes screenings. This checks your fasting blood sugar level. Have the screening done: Once every three years after age 65 if you are at a normal weight and have a low risk for diabetes. More often and at a younger age if you are overweight or have a high risk for diabetes. What should I know about preventing infection? Hepatitis B If you have a higher risk for hepatitis B, you should be screened for this virus. Talk with your health care provider to find out if you are at risk forhepatitis B infection. Hepatitis C Blood testing is recommended for: Everyone born from [birth year] through 1963. Anyone with known risk factors for hepatitis C. Sexually transmitted infections (STIs) You should be screened each year for STIs, including gonorrhea and chlamydia, if: You are sexually active and are younger than 38 years of age. You  are older than 38 years of age and your health care provider tells you that you are at risk for this type of infection. Your sexual activity has changed since you were last screened, and you are at increased risk for chlamydia or gonorrhea. Ask your health care provider if you are at risk. Ask your  health care provider about whether you are at high risk for HIV. Your health care provider may recommend a prescription medicine to help prevent HIV infection. If you choose to take medicine to prevent HIV, you should first get tested for HIV. You should then be tested every 3 months for as long as you are taking the medicine. Follow these instructions at home: Lifestyle Do not use any products that contain nicotine or tobacco, such as cigarettes, e-cigarettes, and chewing tobacco. If you need help quitting, ask your health care provider. Do not use street drugs. Do not share needles. Ask your health care provider for help if you need support or information about quitting drugs. Alcohol use Do not drink alcohol if your health care provider tells you not to drink. If you drink alcohol: Limit how much you have to 0-2 drinks a day. Be aware of how much alcohol is in your drink. In the [place] one drink equals one 12 oz bottle of beer (355 mL), one 5 oz glass of wine (148 mL), or one 1 oz glass of hard liquor (44 mL). General instructions Schedule regular health, dental, and eye exams. Stay current with your vaccines. Tell your health care provider if: You often feel depressed. You have ever been abused or do not feel safe at home. Summary Adopting a healthy lifestyle and getting preventive care are important in promoting health and wellness. Follow your health care provider's instructions about healthy diet, exercising, and getting tested or screened for diseases. Follow your health care provider's instructions on monitoring your cholesterol and blood pressure. This information is not intended to replace advice given to you by your health care provider. Make sure you discuss any questions you have with your healthcare provider. Document Revised: 01/16/2018 Document Reviewed: 01/16/2018 Elsevier Patient Education  2022 [employer].

## 2020-11-04 ENCOUNTER — Encounter: Payer: Self-pay | Admitting: Gastroenterology

## 2020-12-01 ENCOUNTER — Telehealth: Payer: Self-pay | Admitting: *Deleted

## 2020-12-01 NOTE — Telephone Encounter (Signed)
Dr Ardis Hughs,  This pt has been scheduled as a direct colon at the age of 33- you last saw him 04/2018 for intermittent diarrhea and perianal abcess and recommended a colonoscopy but that was never done . He saw his PCP in June who recommended a colon but since it has been 2 plus years, do you need to see him again in the Office?  Please advise, Thanks  [facility]

## 2020-12-01 NOTE — Telephone Encounter (Signed)
PV and Colon canceled until after pt has OV per MD

## 2020-12-14 ENCOUNTER — Encounter: Payer: Managed Care, Other (non HMO) | Admitting: Emergency Medicine

## 2020-12-20 ENCOUNTER — Ambulatory Visit: Payer: Managed Care, Other (non HMO) | Admitting: Nurse Practitioner

## 2020-12-20 ENCOUNTER — Encounter: Payer: Self-pay | Admitting: Nurse Practitioner

## 2020-12-20 ENCOUNTER — Other Ambulatory Visit (INDEPENDENT_AMBULATORY_CARE_PROVIDER_SITE_OTHER): Payer: Managed Care, Other (non HMO)

## 2020-12-20 VITALS — BP 120/80 | HR 68 | Ht 72.0 in | Wt 193.2 lb

## 2020-12-20 DIAGNOSIS — R1030 Lower abdominal pain, unspecified: Secondary | ICD-10-CM

## 2020-12-20 DIAGNOSIS — R1010 Upper abdominal pain, unspecified: Secondary | ICD-10-CM

## 2020-12-20 DIAGNOSIS — K610 Anal abscess: Secondary | ICD-10-CM | POA: Diagnosis not present

## 2020-12-20 LAB — CBC WITH DIFFERENTIAL/PLATELET
Basophils Absolute: 0 10*3/uL (ref 0.0–0.1)
Basophils Relative: 0.9 % (ref 0.0–3.0)
Eosinophils Absolute: 0.1 10*3/uL (ref 0.0–0.7)
Eosinophils Relative: 1.1 % (ref 0.0–5.0)
HCT: 47.2 % (ref 39.0–52.0)
Hemoglobin: 16 g/dL (ref 13.0–17.0)
Lymphocytes Relative: 25.3 % (ref 12.0–46.0)
Lymphs Abs: 1.4 10*3/uL (ref 0.7–4.0)
MCHC: 34 g/dL (ref 30.0–36.0)
MCV: 88.3 fl (ref 78.0–100.0)
Monocytes Absolute: 0.5 10*3/uL (ref 0.1–1.0)
Monocytes Relative: 9.8 % (ref 3.0–12.0)
Neutro Abs: 3.4 10*3/uL (ref 1.4–7.7)
Neutrophils Relative %: 62.9 % (ref 43.0–77.0)
Platelets: 264 10*3/uL (ref 150.0–400.0)
RBC: 5.34 Mil/uL (ref 4.22–5.81)
RDW: 13 % (ref 11.5–15.5)
WBC: 5.4 10*3/uL (ref 4.0–10.5)

## 2020-12-20 LAB — COMPREHENSIVE METABOLIC PANEL
ALT: 41 U/L (ref 0–53)
AST: 26 U/L (ref 0–37)
Albumin: 5.1 g/dL (ref 3.5–5.2)
Alkaline Phosphatase: 34 U/L — ABNORMAL LOW (ref 39–117)
BUN: 14 mg/dL (ref 6–23)
CO2: 29 mEq/L (ref 19–32)
Calcium: 9.6 mg/dL (ref 8.4–10.5)
Chloride: 101 mEq/L (ref 96–112)
Creatinine, Ser: 1.16 mg/dL (ref 0.40–1.50)
GFR: 79.8 mL/min (ref >60.00)
Glucose, Bld: 112 mg/dL — ABNORMAL HIGH (ref 70–99)
Potassium: 3.9 mEq/L (ref 3.5–5.1)
Sodium: 139 mEq/L (ref 135–145)
Total Bilirubin: 0.7 mg/dL (ref 0.2–1.2)
Total Protein: 7.8 g/dL (ref 6.0–8.3)

## 2020-12-20 LAB — LIPASE: Lipase: 35 U/L (ref 11.0–59.0)

## 2020-12-20 MED ORDER — NA SULFATE-K SULFATE-MG SULF 17.5-3.13-1.6 GM/177ML PO SOLN: 1.0000 | Freq: Once | ORAL | 0 refills | Status: AC

## 2020-12-20 NOTE — Progress Notes (Addendum)
ASSESSMENT AND PLAN     # 40 yo male with intermittent generalized upper abdominal pain over the last couple of months.  In setting of heavy Etoh need to consider Etoh gastritis. Rule out PUD. Pancreatitis seems unlikely given sporadic nature of pain but still need to consider. Gallbladder disease is another consideration.  --CBC, CMF , lipase --Schedule for EGD and colonoscopy ( see below) with Dr. Hilarie Fredrickson  Dr. Ardis Hughs has no openings on endoscopy schedule for EGD / colonoscopy until the end of December  The risks and benefits of EGD with possible biopsies were discussed with the patient who agrees to proceed.  --We talked about his Etoh use. He understands that his consumption is excessive and can lead to pancreas and liver problems, amongst other things.   # History of recurrent perianal abscesses ( last one ~ winter 2021). He postponed recommended colonoscopy to rule out Crohn's disease but would like to now proceed. Additionally he has been having intermittent mid lower abdominal pain of unclear etiology --Will proceed with colonoscopy to rule out underlying Crohn's disease.  The risks and benefits of colonoscopy with possible polypectomy / biopsies were discussed and the patient agrees to proceed.    HISTORY OF PRESENT ILLNESS     Chief Complaint :  abdominal pain, history of recurrent rectal abscesses  Frank Galvan is a 40 y.o. male with a past medical history significant for GERD, HLD, etoh abuse, recurrent perianal abscess. See PMH below for any additional history.    Frank Galvan saw Dr. Ardis Hughs in March 2020 for evaluation of  a perianal perianal abscess  He had undergone drainage of the abscess by PCP the month prior. On exam the small abscess had healed. A colonoscopy was recommended to rule our underlying Crohn's disease but patient didn't get around to getting it done,  mainly due to [place] pandemic but also because his mother wanted to be around when it was done.    INTERVAL  HISTORY: Patient says he had a recurrent perianal abscess in the summer of 2020. He had a recurrence last winter. Both times the abscesses spontaneously ruptured, drained purulent material and then healed up.   Frank Galvan back in to talk about scheduling the colonoscopy. Additionally, over the last couple of months he has been having intermittent mid lower abdominal pain.  Pain most often starts before lunch. It has no relation to food as he doesn't eat breakfast and eating lunch doesn't help the pain. It is an achy pain but can be sharp at times. The pain is not related to activity. Episodes  occur about 1-2 times a week and can last several hours at a time. Pain is not relieved with defecation, eventually resolves on its own He takes Citrucel a few times a week. His bowel movements can vary between normal and soft in consistency. No blood in stool.   Additionally he has been having sharp, upper abdominal pain.The pain is sometimes epigastric, other times may be in LUQ or RUQ. The pain has no relation to eating. No associated nausea / vomiting. He rarely takes NSAIDS now but several months ago he was taking them on a regular basis for back pain.   Frank Galvan drinks about 4 beers / day during the week and on the weekend he may have 12 to 16 beers a /day.  This is about the same amount he reported drinking at his 2020 visit here.  Data Reviewed: No labs since December 2021.  PREVIOUS GI EVALUATIONS:   none  Past Medical History:  Diagnosis Date   Acid reflux    GERD (gastroesophageal reflux disease)    Phreesia 01/13/2020   Perianal abscess      Past Surgical History:  Procedure Laterality Date   NO PAST SURGERIES     Family History  Problem Relation Age of Onset   Hypertension Mother    Prostate cancer Father    Stroke Maternal Grandfather    Ovarian cancer Paternal Grandmother    Prostate cancer Paternal Grandfather    Social History   Tobacco Use   Smoking status: Every Day     Packs/day: 0.12    Years: 16.00    Pack years: 1.92    Types: Cigarettes   Smokeless tobacco: Former    Types: Chew  Substance Use Topics   Alcohol use: Yes    Alcohol/week: 0.0 standard drinks    Comment: 6 beers a day   Drug use: Yes    Types: Marijuana    Comment: rare social   Current Outpatient Medications  Medication Sig Dispense Refill   famotidine (PEPCID) 20 MG tablet Take 20 mg by mouth 2 (two) times daily.     omeprazole (PRILOSEC) 20 MG capsule Take 20 mg by mouth daily.     rosuvastatin (CRESTOR) 10 MG tablet Take 1 tablet (10 mg total) by mouth daily. 90 tablet 3   No current facility-administered medications for this visit.   No Known Allergies   Review of Systems: No urinary symptoms. No weight loss. No SOB.    PHYSICAL EXAM :    Wt Readings from Last 3 Encounters:  12/20/20 193 lb 3.2 oz (87.6 kg)  08/02/20 190 lb 3.2 oz (86.3 kg)  01/15/20 192 lb (87.1 kg)    BP 120/80   Pulse 68   Ht 6' (1.829 m)   Wt 193 lb 3.2 oz (87.6 kg)   BMI 26.20 kg/m  Constitutional:  Generally well appearing male in no acute distress. Psychiatric: Pleasant. Normal mood and affect. Behavior is normal. EENT: Pupils normal.  Conjunctivae are normal. No scleral icterus. Neck supple.  Cardiovascular: Normal rate, regular rhythm. No edema Pulmonary/chest: Effort normal and breath sounds normal. No wheezing, rales or rhonchi. Abdominal: Soft, nondistended, nontender. Bowel sounds active throughout. There are no masses palpable. No hepatomegaly. Neurological: Alert and oriented to person place and time. Skin: Skin is warm and dry. No rashes noted.  Tye Savoy, NP  12/20/2020, 1:58 PM

## 2020-12-20 NOTE — Patient Instructions (Signed)
If you are age 38 or younger, your body mass index should be between 19-25. Your Body mass index is 26.2 kg/m. If this is out of the aformentioned range listed, please consider follow up with your Primary Care Provider.  ________________________________________________________  The [place] [facility] providers would like to encourage you to use [facility] to communicate with providers for non-urgent requests or questions.  Due to long hold times on the telephone, sending your provider a message by [facility] may be a faster and more efficient way to get a response.  Please allow 48 business hours for a response.  Please remember that this is for non-urgent requests.  _______________________________________________________  Frank Galvan have been scheduled for an endoscopy and colonoscopy. Please follow the written instructions given to you at your visit today. Please pick up your prep supplies at the pharmacy within the next 1-3 days. If you use inhalers (even only as needed), please bring them with you on the day of your procedure.  Your provider has requested that you go to the basement level for lab work before leaving today. Press "B" on the elevator. The lab is located at the first door on the left as you exit the elevator.   Marland Kitchen

## 2020-12-21 NOTE — Progress Notes (Signed)
Addendum: Reviewed and agree with assessment and management plan. Felicie Kocher M, MD  

## 2020-12-21 NOTE — Progress Notes (Signed)
I agree with the above note, plan.  [name], thanks for helping with him.

## 2020-12-28 ENCOUNTER — Encounter: Payer: Self-pay | Admitting: Emergency Medicine

## 2020-12-28 ENCOUNTER — Other Ambulatory Visit: Payer: Self-pay

## 2020-12-28 ENCOUNTER — Ambulatory Visit (INDEPENDENT_AMBULATORY_CARE_PROVIDER_SITE_OTHER): Payer: Managed Care, Other (non HMO) | Admitting: Emergency Medicine

## 2020-12-28 VITALS — BP 116/60 | HR 97 | Ht 72.0 in | Wt 193.0 lb

## 2020-12-28 DIAGNOSIS — Z125 Encounter for screening for malignant neoplasm of prostate: Secondary | ICD-10-CM | POA: Diagnosis not present

## 2020-12-28 DIAGNOSIS — Z1329 Encounter for screening for other suspected endocrine disorder: Secondary | ICD-10-CM | POA: Diagnosis not present

## 2020-12-28 DIAGNOSIS — Z13220 Encounter for screening for lipoid disorders: Secondary | ICD-10-CM | POA: Diagnosis not present

## 2020-12-28 DIAGNOSIS — Z8042 Family history of malignant neoplasm of prostate: Secondary | ICD-10-CM | POA: Diagnosis not present

## 2020-12-28 DIAGNOSIS — Z13228 Encounter for screening for other metabolic disorders: Secondary | ICD-10-CM

## 2020-12-28 DIAGNOSIS — Z0000 Encounter for general adult medical examination without abnormal findings: Secondary | ICD-10-CM

## 2020-12-28 DIAGNOSIS — Z130 Encounter for screening for diseases of the blood and blood-forming organs and certain disorders involving the immune mechanism: Secondary | ICD-10-CM

## 2020-12-28 LAB — CBC WITH DIFFERENTIAL/PLATELET
Basophils Absolute: 0 10*3/uL (ref 0.0–0.1)
Basophils Relative: 1.1 % (ref 0.0–3.0)
Eosinophils Absolute: 0.1 10*3/uL (ref 0.0–0.7)
Eosinophils Relative: 2.4 % (ref 0.0–5.0)
HCT: 45.2 % (ref 39.0–52.0)
Hemoglobin: 15.1 g/dL (ref 13.0–17.0)
Lymphocytes Relative: 32.9 % (ref 12.0–46.0)
Lymphs Abs: 1.3 10*3/uL (ref 0.7–4.0)
MCHC: 33.5 g/dL (ref 30.0–36.0)
MCV: 88.5 fl (ref 78.0–100.0)
Monocytes Absolute: 0.5 10*3/uL (ref 0.1–1.0)
Monocytes Relative: 13.9 % — ABNORMAL HIGH (ref 3.0–12.0)
Neutro Abs: 2 10*3/uL (ref 1.4–7.7)
Neutrophils Relative %: 49.7 % (ref 43.0–77.0)
Platelets: 250 10*3/uL (ref 150.0–400.0)
RBC: 5.11 Mil/uL (ref 4.22–5.81)
RDW: 13.1 % (ref 11.5–15.5)
WBC: 4 10*3/uL (ref 4.0–10.5)

## 2020-12-28 LAB — COMPREHENSIVE METABOLIC PANEL
ALT: 39 U/L (ref 0–53)
AST: 24 U/L (ref 0–37)
Albumin: 4.9 g/dL (ref 3.5–5.2)
Alkaline Phosphatase: 30 U/L — ABNORMAL LOW (ref 39–117)
BUN: 14 mg/dL (ref 6–23)
CO2: 28 mEq/L (ref 19–32)
Calcium: 9.5 mg/dL (ref 8.4–10.5)
Chloride: 102 mEq/L (ref 96–112)
Creatinine, Ser: 1.17 mg/dL (ref 0.40–1.50)
GFR: 78.97 mL/min (ref >60.00)
Glucose, Bld: 101 mg/dL — ABNORMAL HIGH (ref 70–99)
Potassium: 4.5 mEq/L (ref 3.5–5.1)
Sodium: 138 mEq/L (ref 135–145)
Total Bilirubin: 0.8 mg/dL (ref 0.2–1.2)
Total Protein: 7.1 g/dL (ref 6.0–8.3)

## 2020-12-28 LAB — PSA: PSA: 0.79 ng/mL (ref 0.10–4.00)

## 2020-12-28 LAB — LIPID PANEL
Cholesterol: 167 mg/dL (ref 0–200)
HDL: 56 mg/dL (ref >39.00)
NonHDL: 111.45
Total CHOL/HDL Ratio: 3
Triglycerides: 202 mg/dL — ABNORMAL HIGH (ref 0.0–149.0)
VLDL: 40.4 mg/dL — ABNORMAL HIGH (ref 0.0–40.0)

## 2020-12-28 LAB — HEMOGLOBIN A1C: Hgb A1c MFr Bld: 5.4 % (ref 4.6–6.5)

## 2020-12-28 LAB — LDL CHOLESTEROL, DIRECT: Direct LDL: 86 mg/dL

## 2020-12-28 NOTE — Patient Instructions (Signed)

## 2020-12-28 NOTE — Progress Notes (Signed)
Frank Galvan 40 y.o.   Chief Complaint  Patient presents with   Annual Exam    HISTORY OF PRESENT ILLNESS: This is a 40 y.o. male here for annual exam. Chronic smoker and chronic EtOH user. Was seen by GI doctor last week.  Scheduled for upper and lower endoscopy next week. Office visit assessment and plan as follows: ASSESSMENT AND PLAN       # 40 yo male with intermittent generalized upper abdominal pain over the last couple of months.  In setting of heavy Etoh need to consider Etoh gastritis. Rule out PUD. Pancreatitis seems unlikely given sporadic nature of pain but still need to consider. Gallbladder disease is another consideration.  --CBC, CMF , lipase --Schedule for EGD and colonoscopy ( see below) with Dr. Hilarie Fredrickson  Dr. Ardis Hughs has no openings on endoscopy schedule for EGD / colonoscopy until the end of December  The risks and benefits of EGD with possible biopsies were discussed with the patient who agrees to proceed.  --We talked about his Etoh use. He understands that his consumption is excessive and can lead to pancreas and liver problems, amongst other things.    # History of recurrent perianal abscesses ( last one ~ winter 2021). He postponed recommended colonoscopy to rule out Crohn's disease but would like to now proceed. Additionally he has been having intermittent mid lower abdominal pain of unclear etiology --Will proceed with colonoscopy to rule out underlying Crohn's disease.  The risks and benefits of colonoscopy with possible polypectomy / biopsies were discussed and the patient agrees to proceed.   HPI   Prior to Admission medications   Medication Sig Start Date End Date Taking? Authorizing Provider  famotidine (PEPCID) 20 MG tablet Take 20 mg by mouth 2 (two) times daily.   Yes [provider]  omeprazole (PRILOSEC) 20 MG capsule Take 20 mg by mouth daily.   Yes [provider]  rosuvastatin (CRESTOR) 10 MG tablet Take 1 tablet (10 mg total) by  mouth daily. 01/21/20  Yes Horald Pollen, MD    No Known Allergies  Patient Active Problem List   Diagnosis Date Noted   Anal pain 03/10/2018   Elevated triglycerides with high cholesterol 05/07/2015    Past Medical History:  Diagnosis Date   Acid reflux    GERD (gastroesophageal reflux disease)    Phreesia 01/13/2020   Perianal abscess     Past Surgical History:  Procedure Laterality Date   NO PAST SURGERIES      Social History   Socioeconomic History   Marital status: Single    Spouse name: Not on file   Number of children: 0   Years of education: Not on file   Highest education level: Not on file  Occupational History   Occupation: Engineering  Tobacco Use   Smoking status: Every Day    Packs/day: 0.12    Years: 16.00    Pack years: 1.92    Types: Cigarettes   Smokeless tobacco: Former    Types: Chew  Substance and Sexual Activity   Alcohol use: Yes    Alcohol/week: 0.0 standard drinks    Comment: 6 beers a day   Drug use: Yes    Types: Marijuana    Comment: rare social   Sexual activity: Yes    Partners: Female  Other Topics Concern   Not on file  Social History Narrative   Single   No children   Engineer, site   No current exercise  Social Determinants of Health   Financial Resource Strain: Not on file  Food Insecurity: Not on file  Transportation Needs: Not on file  Physical Activity: Not on file  Stress: Not on file  Social Connections: Not on file  Intimate Partner Violence: Not on file    Family History  Problem Relation Age of Onset   Hypertension Mother    Prostate cancer Father    Stroke Maternal Grandfather    Ovarian cancer Paternal Grandmother    Prostate cancer Paternal Grandfather      Review of Systems  Constitutional: Negative.  Negative for chills and fever.  HENT: Negative.  Negative for congestion and sore throat.   Respiratory: Negative.  Negative for cough and shortness of breath.    Cardiovascular: Negative.  Negative for chest pain and palpitations.  Gastrointestinal:  Negative for abdominal pain, blood in stool, diarrhea, melena, nausea and vomiting.       Chronic history of perianal abscesses.  No history of Crohn's disease.  Genitourinary: Negative.  Negative for dysuria and hematuria.  Musculoskeletal: Negative.   Skin: Negative.  Negative for rash.  Neurological: Negative.  Negative for dizziness and headaches.  All other systems reviewed and are negative. Today's Vitals   12/28/20 0855  BP: 116/60  Pulse: 97  SpO2: 98%  Weight: 193 lb (87.5 kg)  Height: 6' (1.829 m)   Body mass index is 26.18 kg/m.   Physical Exam Vitals reviewed.  Constitutional:      Appearance: Normal appearance.  HENT:     Head: Normocephalic.     Right Ear: Tympanic membrane, ear canal and external ear normal.     Left Ear: Tympanic membrane, ear canal and external ear normal.     Mouth/Throat:     Mouth: Mucous membranes are moist.     Pharynx: Oropharynx is clear.  Eyes:     Extraocular Movements: Extraocular movements intact.     Conjunctiva/sclera: Conjunctivae normal.     Pupils: Pupils are equal, round, and reactive to light.  Cardiovascular:     Rate and Rhythm: Normal rate and regular rhythm.     Pulses: Normal pulses.     Heart sounds: Normal heart sounds.  Pulmonary:     Effort: Pulmonary effort is normal.     Breath sounds: Normal breath sounds.  Abdominal:     General: There is no distension.     Palpations: Abdomen is soft. There is no mass.     Tenderness: There is no abdominal tenderness.  Musculoskeletal:     Cervical back: Normal range of motion and neck supple.  Skin:    Capillary Refill: Capillary refill takes less than 2 seconds.  Neurological:     General: No focal deficit present.     Mental Status: He is alert and oriented to person, place, and time.  Psychiatric:        Mood and Affect: Mood normal.        Behavior: Behavior normal.      ASSESSMENT & PLAN: Problem List Items Addressed This Visit   None Visit Diagnoses     Routine general medical examination at a health care facility    -  Primary   Prostate cancer screening       Relevant Orders   PSA(Must document that pt has been informed of limitations of PSA testing.)   Family history of prostate cancer       Relevant Orders   PSA(Must document that pt has been informed  of limitations of PSA testing.)   Screening for deficiency anemia       Relevant Orders   CBC with Differential   Screening for lipoid disorders       Relevant Orders   Lipid panel   Screening for endocrine, metabolic and immunity disorder       Relevant Orders   Comprehensive metabolic panel   Hemoglobin A1c      Modifiable risk factors discussed with patient. Anticipatory guidance according to age provided. The following topics were also discussed: Social Determinants of Health Smoking.  Smoking cessation advice given.  Cardiovascular and cancer risks associated with smoking discussed. Diet and nutrition Benefits of exercise Cancer family history of prostate cancer. Need to follow-up with GI doctor next week for upper and lower endoscopies as scheduled. Vaccinations recommendations Cardiovascular risk assessment Mental health including depression and anxiety Fall and accident prevention  Patient Instructions  Health Maintenance, Male Adopting a healthy lifestyle and getting preventive care are important in promoting health and wellness. Ask your health care provider about: The right schedule for you to have regular tests and exams. Things you can do on your own to prevent diseases and keep yourself healthy. What should I know about diet, weight, and exercise? Eat a healthy diet  Eat a diet that includes plenty of vegetables, fruits, low-fat dairy products, and lean protein. Do not eat a lot of foods that are high in solid fats, added sugars, or sodium. Maintain a healthy  weight Body mass index (BMI) is a measurement that can be used to identify possible weight problems. It estimates body fat based on height and weight. Your health care provider can help determine your BMI and help you achieve or maintain a healthy weight. Get regular exercise Get regular exercise. This is one of the most important things you can do for your health. Most adults should: Exercise for at least 150 minutes each week. The exercise should increase your heart rate and make you sweat (moderate-intensity exercise). Do strengthening exercises at least twice a week. This is in addition to the moderate-intensity exercise. Spend less time sitting. Even light physical activity can be beneficial. Watch cholesterol and blood lipids Have your blood tested for lipids and cholesterol at 38 years of age, then have this test every 5 years. You may need to have your cholesterol levels checked more often if: Your lipid or cholesterol levels are high. You are older than 38 years of age. You are at high risk for heart disease. What should I know about cancer screening? Many types of cancers can be detected early and may often be prevented. Depending on your health history and family history, you may need to have cancer screening at various ages. This may include screening for: Colorectal cancer. Prostate cancer. Skin cancer. Lung cancer. What should I know about heart disease, diabetes, and high blood pressure? Blood pressure and heart disease High blood pressure causes heart disease and increases the risk of stroke. This is more likely to develop in people who have high blood pressure readings or are overweight. Talk with your health care provider about your target blood pressure readings. Have your blood pressure checked: Every 3-5 years if you are 54-42 years of age. Every year if you are 81 years old or older. If you are between the ages of 55 and 8 and are a current or former smoker, ask your  health care provider if you should have a one-time screening for abdominal aortic aneurysm (AAA). Diabetes  Have regular diabetes screenings. This checks your fasting blood sugar level. Have the screening done: Once every three years after age 54 if you are at a normal weight and have a low risk for diabetes. More often and at a younger age if you are overweight or have a high risk for diabetes. What should I know about preventing infection? Hepatitis B If you have a higher risk for hepatitis B, you should be screened for this virus. Talk with your health care provider to find out if you are at risk for hepatitis B infection. Hepatitis C Blood testing is recommended for: Everyone born from [birth year] through 1963. Anyone with known risk factors for hepatitis C. Sexually transmitted infections (STIs) You should be screened each year for STIs, including gonorrhea and chlamydia, if: You are sexually active and are younger than 38 years of age. You are older than 38 years of age and your health care provider tells you that you are at risk for this type of infection. Your sexual activity has changed since you were last screened, and you are at increased risk for chlamydia or gonorrhea. Ask your health care provider if you are at risk. Ask your health care provider about whether you are at high risk for HIV. Your health care provider may recommend a prescription medicine to help prevent HIV infection. If you choose to take medicine to prevent HIV, you should first get tested for HIV. You should then be tested every 3 months for as long as you are taking the medicine. Follow these instructions at home: Alcohol use Do not drink alcohol if your health care provider tells you not to drink. If you drink alcohol: Limit how much you have to 0-2 drinks a day. Know how much alcohol is in your drink. In the [place] one drink equals one 12 oz bottle of beer (355 mL), one 5 oz glass of wine (148 mL), or one 1 oz glass  of hard liquor (44 mL). Lifestyle Do not use any products that contain nicotine or tobacco. These products include cigarettes, chewing tobacco, and vaping devices, such as e-cigarettes. If you need help quitting, ask your health care provider. Do not use street drugs. Do not share needles. Ask your health care provider for help if you need support or information about quitting drugs. General instructions Schedule regular health, dental, and eye exams. Stay current with your vaccines. Tell your health care provider if: You often feel depressed. You have ever been abused or do not feel safe at home. Summary Adopting a healthy lifestyle and getting preventive care are important in promoting health and wellness. Follow your health care provider's instructions about healthy diet, exercising, and getting tested or screened for diseases. Follow your health care provider's instructions on monitoring your cholesterol and blood pressure. This information is not intended to replace advice given to you by your health care provider. Make sure you discuss any questions you have with your health care provider. Document Revised: 06/14/2020 Document Reviewed: 06/14/2020 Elsevier Patient Education  2022 Tiger, MD [facility]

## 2021-01-05 ENCOUNTER — Telehealth: Payer: Self-pay | Admitting: Nurse Practitioner

## 2021-01-05 NOTE — Telephone Encounter (Signed)
Patient's EGD has been cancelled, but will proceed with Colonoscopy.

## 2021-01-05 NOTE — Telephone Encounter (Signed)
[employer] has denied this pt's EGD after reviewing clinical notes, procedure is scheduled for tomorrow.   The exam should be needed for one of the following reasons new onset of symptoms of 40 years of age, failure to improve following treatment for h-pylori or a trial of at least 4 weeks of proton pump inhibitor therapy, 1st degree relative with a history of serious disease in the esophagus stomach or duodenum, unintentional weight loss of more than 5 percent during the last 6-12 months, anorexia, suspected upper gastrointestinal tract or gi bleeding based on blood tests exams and or history, trouble swallowing, chest pain when swallowing, throwing up for at least 7 days, abnormal results of a picture study of the esophagus, stomach or duodenum, physical exam findings of a mass in abdomen.  Please advise if a peer to peer is needed

## 2021-01-06 ENCOUNTER — Other Ambulatory Visit: Payer: Self-pay

## 2021-01-06 ENCOUNTER — Encounter: Payer: Self-pay | Admitting: Internal Medicine

## 2021-01-06 ENCOUNTER — Ambulatory Visit (AMBULATORY_SURGERY_CENTER): Payer: Managed Care, Other (non HMO) | Admitting: Internal Medicine

## 2021-01-06 VITALS — BP 115/80 | HR 62 | Temp 98.9°F | Resp 18 | Ht 72.0 in | Wt 193.0 lb

## 2021-01-06 DIAGNOSIS — D123 Benign neoplasm of transverse colon: Secondary | ICD-10-CM

## 2021-01-06 DIAGNOSIS — D128 Benign neoplasm of rectum: Secondary | ICD-10-CM

## 2021-01-06 DIAGNOSIS — K610 Anal abscess: Secondary | ICD-10-CM | POA: Diagnosis not present

## 2021-01-06 DIAGNOSIS — R1030 Lower abdominal pain, unspecified: Secondary | ICD-10-CM

## 2021-01-06 DIAGNOSIS — K5730 Diverticulosis of large intestine without perforation or abscess without bleeding: Secondary | ICD-10-CM | POA: Diagnosis not present

## 2021-01-06 MED ORDER — SODIUM CHLORIDE 0.9 % IV SOLN: 500.0000 mL | Freq: Once | INTRAVENOUS | Status: DC

## 2021-01-06 MED ORDER — OMEPRAZOLE 40 MG PO CPDR
40.0000 mg | DELAYED_RELEASE_CAPSULE | Freq: Every day | ORAL | 2 refills | Status: DC
Start: 2021-01-06 — End: 2021-04-07

## 2021-01-06 NOTE — Progress Notes (Signed)
Patient presents for colonoscopy in the outpatient setting Plan had been for EGD but this was not approved by his insurance, see recent documentation  Seed note dated 12/20/2020 by Tye Savoy, NP for details  He remains appropriate for colonoscopy in the outpatient setting

## 2021-01-06 NOTE — Progress Notes (Signed)
Pt's states no medical or surgical changes since previsit or office visit.   DT vitals. 

## 2021-01-06 NOTE — Telephone Encounter (Signed)
Frank Bloodgood Do you know if this patient's abd pain x 4+ weeks is in the setting of PPI Omeprazole is on the list in your note and if he was taking then epigastric pain not improved with PPI would be reason for EGD (which should be approved) Thanks JMP

## 2021-01-06 NOTE — Op Note (Signed)
[facility] Patient Name: Frank Galvan Procedure Date: 01/06/2021 1:42 PM MRN: [id number] Endoscopist: Jerene Bears , MD Age: 40 Referring MD:  Date of Birth: Jun 19, 1980 Gender: Male Account #: [id number] Procedure:                Colonoscopy Indications:              Lower abdominal pain, history of recurrent perianal                            abscesses for exclusion of Crohn's disease Medicines:                Monitored Anesthesia Care Procedure:                Pre-Anesthesia Assessment:                           - Prior to the procedure, a History and Physical                            was performed, and patient medications and                            allergies were reviewed. The patient's tolerance of                            previous anesthesia was also reviewed. The risks                            and benefits of the procedure and the sedation                            options and risks were discussed with the patient.                            All questions were answered, and informed consent                            was obtained. Prior Anticoagulants: The patient has                            taken no previous anticoagulant or antiplatelet                            agents. ASA Grade Assessment: II - A patient with                            mild systemic disease. After reviewing the risks                            and benefits, the patient was deemed in                            satisfactory condition to undergo the procedure.  After obtaining informed consent, the colonoscope                            was passed under direct vision. Throughout the                            procedure, the patient's blood pressure, pulse, and                            oxygen saturations were monitored continuously. The                            CF [id number] #[id number] was introduced through the anus                            and advanced to the  cecum, identified by the                            ileocecal valve. The colonoscopy was performed                            without difficulty. The patient tolerated the                            procedure well. The quality of the bowel                            preparation was good. The ileocecal valve,                            appendiceal orifice, and rectum were photographed. Scope In: 2:15:08 PM Scope Out: 2:32:37 PM Scope Withdrawal Time: 0 hours 14 minutes 23 seconds  Total Procedure Duration: 0 hours 17 minutes 29 seconds  Findings:                 The digital rectal exam was normal.                           The terminal ileum appeared normal.                           A 7 mm polyp was found in the transverse colon. The                            polyp was sessile. The polyp was removed with a                            cold snare. Resection and retrieval were complete.                           Two sessile polyps were found in the proximal                            rectum. The polyps were 3 to  5 mm in size. These                            polyps were removed with a cold snare. Resection                            and retrieval were complete.                           Multiple small-mouthed diverticula were found in                            the sigmoid colon and transverse colon.                           Internal hemorrhoids were found during                            retroflexion. The hemorrhoids were small in size.                            Anal papillae were hypertrophied. No evidence of                            rectal or perianal Crohn's disease. Complications:            No immediate complications. Estimated Blood Loss:     Estimated blood loss was minimal. Impression:               - The examined portion of the ileum was normal.                           - One 7 mm polyp in the transverse colon, removed                            with a cold snare. Resected  and retrieved.                           - Two 3 to 5 mm polyps in the proximal rectum,                            removed with a cold snare. Resected and retrieved.                           - Diverticulosis in the sigmoid colon and in the                            transverse colon.                           - Small internal hemorrhoids.                           - No evidence of inflammatory change today, fistula  or abscess. No evidence on this exam to support                            ileocolonic or perianal Crohn's. Recommendation:           - Patient has a contact number available for                            emergencies. The signs and symptoms of potential                            delayed complications were discussed with the                            patient. Return to normal activities tomorrow.                            Written discharge instructions were provided to the                            patient.                           - Resume previous diet.                           - Continue present medications.                           - Await pathology results.                           - If recurrent perianal abscess then pelvic MRI and                            surgical follow-up is recommended.                           - Increase omeprazole to 40 mg once daily for                            epigastric pain. EGD recommended today was not                            approved by insurance unless epigastric pain                            continues after PPI trial. If upper abdominal pain                            continues after 1 month please call my office.                           - Repeat colonoscopy is recommended for  surveillance. The colonoscopy date will be                            determined after pathology results from today's                            exam become available for review. Jerene Bears,  MD 01/06/2021 2:42:43 PM This report has been signed electronically.

## 2021-01-06 NOTE — Patient Instructions (Addendum)
YOU HAD AN ENDOSCOPIC PROCEDURE TODAY AT [facility]:   Refer to the procedure report that was given to you for any specific questions about what was found during the examination.  If the procedure report does not answer your questions, please call your gastroenterologist to clarify.  If you requested that your care partner not be given the details of your procedure findings, then the procedure report has been included in a sealed envelope for you to review at your convenience later.  YOU SHOULD EXPECT: Some feelings of bloating in the abdomen. Passage of more gas than usual.  Walking can help get rid of the air that was put into your GI tract during the procedure and reduce the bloating. If you had a lower endoscopy (such as a colonoscopy or flexible sigmoidoscopy) you may notice spotting of blood in your stool or on the toilet paper. If you underwent a bowel prep for your procedure, you may not have a normal bowel movement for a few days.  Please Note:  You might notice some irritation and congestion in your nose or some drainage.  This is from the oxygen used during your procedure.  There is no need for concern and it should clear up in a day or so.  SYMPTOMS TO REPORT IMMEDIATELY:  Following lower endoscopy (colonoscopy or flexible sigmoidoscopy):  Excessive amounts of blood in the stool  Significant tenderness or worsening of abdominal pains  Swelling of the abdomen that is new, acute  Fever of 100F or higher    For urgent or emergent issues, a gastroenterologist can be reached at any hour by calling [phone]. Do not use MyChart messaging for urgent concerns.    DIET:  We do recommend a small meal at first, but then you may proceed to your regular diet.  Drink plenty of fluids but you should avoid alcoholic beverages for 24 hours.  ACTIVITY:  You should plan to take it easy for the rest of today and you should NOT DRIVE or use heavy machinery until tomorrow (because  of the sedation medicines used during the test).    FOLLOW UP: Our staff will call the number listed on your records 48-72 hours following your procedure to check on you and address any questions or concerns that you may have regarding the information given to you following your procedure. If we do not reach you, we will leave a message.  We will attempt to reach you two times.  During this call, we will ask if you have developed any symptoms of COVID 19. If you develop any symptoms (ie: fever, flu-like symptoms, shortness of breath, cough etc.) before then, please call [phone].  If you test positive for Covid 19 in the 2 weeks post procedure, please call and report this information to [place].    If any biopsies were taken you will be contacted by phone or by letter within the next 1-3 weeks.  Please call us at [phone] if you have not heard about the biopsies in 3 weeks.    SIGNATURES/CONFIDENTIALITY: You and/or your care partner have signed paperwork which will be entered into your electronic medical record.  These signatures attest to the fact that that the information above on your After Visit Summary has been reviewed and is understood.  Full responsibility of the confidentiality of this discharge information lies with you and/or your care-partner.     Start omeprazole 40 mg daily resume  remainder of medications. Information given on polyps  and hemorrhoids.

## 2021-01-06 NOTE — Care Management Note (Signed)
Pt stable to RR 

## 2021-01-07 ENCOUNTER — Encounter: Payer: Managed Care, Other (non HMO) | Admitting: Gastroenterology

## 2021-01-07 NOTE — Telephone Encounter (Signed)
Pt scheduled to see Tye Savoy NP 02/08/21 at 11am. Pt aware of appt.

## 2021-01-10 ENCOUNTER — Telehealth: Payer: Self-pay

## 2021-01-10 ENCOUNTER — Telehealth: Payer: Self-pay | Admitting: *Deleted

## 2021-01-10 NOTE — Telephone Encounter (Signed)
  Follow up Call-  Call back number 01/06/2021  Post procedure Call Back phone  # [phone]  Permission to leave phone message Yes  Some recent data might be hidden     Patient questions:  Do you have a fever, pain , or abdominal swelling? No. Pain Score  0 *  Have you tolerated food without any problems? Yes.    Have you been able to return to your normal activities? Yes.    Do you have any questions about your discharge instructions: Diet   No. Medications  No. Follow up visit  No.  Do you have questions or concerns about your Care? No.  Actions: * If pain score is 4 or above: No action needed, pain <4.

## 2021-01-10 NOTE — Telephone Encounter (Signed)
Message left

## 2021-01-11 ENCOUNTER — Encounter: Payer: Self-pay | Admitting: Internal Medicine

## 2021-02-08 ENCOUNTER — Encounter: Payer: Self-pay | Admitting: Nurse Practitioner

## 2021-02-08 ENCOUNTER — Ambulatory Visit: Payer: Managed Care, Other (non HMO) | Admitting: Nurse Practitioner

## 2021-02-08 VITALS — BP 142/82 | HR 74 | Ht 72.0 in | Wt 192.0 lb

## 2021-02-08 DIAGNOSIS — F1010 Alcohol abuse, uncomplicated: Secondary | ICD-10-CM

## 2021-02-08 DIAGNOSIS — R1010 Upper abdominal pain, unspecified: Secondary | ICD-10-CM

## 2021-02-08 NOTE — Progress Notes (Signed)
ASSESSMENT AND PLAN    # 41 yo male with persistent upper abdominal pain despite recent increase in Omeprazole dose to 40 mg daily. Rule out PUD. In setting of Etoh abuse rule out Etoh gastritis, pancreatitis. Normal amylase, lipase, CMP and CBC normal.  --Schedule for EGD.  The risks and benefits of EGD with possible biopsies were discussed with the patient who agrees to proceed.  --Continue Omeprazole 40 mg daily  --We discussed the need to significantly reduce Etoh consumption    --If EGD negative and pain persists then consider CTAP to evaluate for pancreatitis.    # Etoh abuse, drinks several beers a day. A couple of times a weeks he consumes 15-20 beers a day . Heavy use for 10 years.  --We discussed need to significantly decrease Etoh intake as it can lead to pancreatitis and liver disease among other problems.  Patient feels he doesn't need outside help at this time.    # Hx of small adenomatous colon polyps ( three)  November 2022. Recall colonoscopy due December 2029.  Currently listed to be done by Dr. Hilarie Fredrickson, will have changed to Dr. Ardis Hughs , patient's primary GI.   HISTORY OF PRESENT ILLNESS    Chief Complaint : Upper abdominal pain  Frank Galvan is a 41 y.o. male known to Dr. Ardis Hughs with a past medical history of perianal abscesses, diverticulosis, adenomatous colon polyps, Etoh abuse.  Additional medical history as listed in [place] .   Patient was last seen in November for follow up on perianal abscesses for which he had been seen in 2020 but never got recommended colonoscopy to evaluate for Crohn's. Disease. At the time of that visit he was scheduled for a colonoscopy but also for EGD to evaluate upper abdominal pain despite chronic omeprazole 20 mg daily. He has a history of heavy Etoh use. Lipase CMP and CBC were unremarkable. Colonoscopy negative for Crohn's remarkable for diverticulosis, hemorrhoids and a few polyps which were removed. Insurance company denied EGD ( ?  Wanted him to first try prescription strength PPI). Therefore Omeprazole dose was increased to 40 mg daily one month ago. He is here for follow up.   INTERVAL HISTORY He hasn't had any improvement in upper abdominal pain since increase in PPI dose. Since I saw him in November he [facility] including Amylase and lipase which were normal.  The pain mainly involves epigastrium and LUQ . It starts within a couple of hours after PO intake ( even just coffee). Pain not any worse with solid food. Occasionally he has associated mid and lower back pain. He feels like episodes of pain are becoming more frequent, sometimes 3-4 times a week. Episodes can last a few hours. Gas-x and Pepcid are not consistently helpful.  No associated N/V.   [place] is trying to reduce Etoh consumption. He had cut back Etoh but didn't notice any significant improvement in pain. Now back to heavy drinking since the holidays. Averages four to six 12-16 oz beers / day .   About twice a weeks he may drink 15 to 20 beers a day.    PREVIOUS ENDOSCOPIC EVALUATIONS / PERTINENT STUDIES:   Colonoscopy December 2022 -The examined portion of the ileum was normal. - One 7 mm polyp in the transverse colon, removed with a cold snare. Resected and retrieved. - Two 3 to 5 mm polyps in the proximal rectum, removed with a cold snare. Resected and retrieved. - Diverticulosis in the sigmoid  colon and in the transverse colon. - Small internal hemorrhoids. - No evidence of inflammatory change today, fistula or abscess. No evidence on this exam tosupport ileocolonic or perianal Crohn's.  Surgical [P], colon, transverse, polyp (1) - TUBULAR ADENOMA WITHOUT HIGH-GRADE DYSPLASIA OR MALIGNANCY 2. Surgical [P], colon, rectum, polyp (2) - TUBULAR ADENOMA WITHOUT HIGH-GRADE DYSPLASIA OR MALIGNANCY - HYPERPLASTIC POLYP   Current Medications, Allergies, Past Medical History, Past Surgical History, Family History and Social History were  reviewed in [employer] record.     Current Outpatient Medications  Medication Sig Dispense Refill   famotidine (PEPCID) 20 MG tablet Take 20 mg by mouth 2 (two) times daily.     omeprazole (PRILOSEC) 40 MG capsule Take 1 capsule (40 mg total) by mouth daily. 30 capsule 2   rosuvastatin (CRESTOR) 10 MG tablet Take 1 tablet (10 mg total) by mouth daily. 90 tablet 3   No current facility-administered medications for this visit.    Review of Systems: No chest pain. No shortness of breath. No urinary complaints.   PHYSICAL EXAM :    Wt Readings from Last 3 Encounters:  02/08/21 192 lb (87.1 kg)  01/06/21 193 lb (87.5 kg)  12/28/20 193 lb (87.5 kg)    BP (!) 142/82    Pulse 74    Ht 6' (1.829 m)    Wt 192 lb (87.1 kg)    BMI 26.04 kg/m  Constitutional:  Generally well appearing male in no acute distress. Psychiatric: Pleasant. Normal mood and affect. Behavior is normal. EENT: Pupils normal.  Conjunctivae are normal. No scleral icterus. Neck supple.  Cardiovascular: Normal rate, regular rhythm. No edema Pulmonary/chest: Effort normal and breath sounds normal. No wheezing, rales or rhonchi. Abdominal: Soft, nondistended, nontender. Bowel sounds active throughout. There are no masses palpable. No hepatomegaly. Neurological: Alert and oriented to person place and time. Skin: Skin is warm and dry. No rashes noted.  Tye Savoy, NP  02/08/2021, 11:39 AM  Cc:  Horald Pollen, *

## 2021-02-08 NOTE — Patient Instructions (Signed)
Continue Omeprazole 40 mg daily  You have been scheduled for an endoscopy. Please follow written instructions given to you at your visit today. If you use inhalers (even only as needed), please bring them with you on the day of your procedure.   Due to recent changes in healthcare laws, you may see the results of your imaging and laboratory studies on MyChart before your provider has had a chance to review them.  We understand that in some cases there may be results that are confusing or concerning to you. Not all laboratory results come back in the same time frame and the provider may be waiting for multiple results in order to interpret others.  Please give [place] 48 hours in order for your provider to thoroughly review all the results before contacting the office for clarification of your results.    If you are age 55 or older, your body mass index should be between 23-30. Your Body mass index is 26.04 kg/m. If this is out of the aforementioned range listed, please consider follow up with your Primary Care Provider.  If you are age 66 or younger, your body mass index should be between 19-25. Your Body mass index is 26.04 kg/m. If this is out of the aformentioned range listed, please consider follow up with your Primary Care Provider.   ________________________________________________________  The [facility] providers would like to encourage you to use [facility] to communicate with providers for non-urgent requests or questions.  Due to long hold times on the telephone, sending your provider a message by [facility] may be a faster and more efficient way to get a response.  Please allow 48 business hours for a response.  Please remember that this is for non-urgent requests.  _______________________________________________________   Thank you for choosing [facility]  [place]

## 2021-02-09 NOTE — Progress Notes (Signed)
I agree with the above note, plan 

## 2021-03-01 ENCOUNTER — Encounter: Payer: Self-pay | Admitting: Gastroenterology

## 2021-03-03 ENCOUNTER — Encounter: Payer: Self-pay | Admitting: Certified Registered Nurse Anesthetist

## 2021-03-04 ENCOUNTER — Encounter: Payer: Self-pay | Admitting: Gastroenterology

## 2021-03-04 ENCOUNTER — Ambulatory Visit (AMBULATORY_SURGERY_CENTER): Payer: Managed Care, Other (non HMO) | Admitting: Gastroenterology

## 2021-03-04 VITALS — BP 114/72 | HR 68 | Temp 98.7°F | Resp 11 | Ht 72.0 in | Wt 192.0 lb

## 2021-03-04 DIAGNOSIS — R1010 Upper abdominal pain, unspecified: Secondary | ICD-10-CM | POA: Diagnosis present

## 2021-03-04 DIAGNOSIS — K2970 Gastritis, unspecified, without bleeding: Secondary | ICD-10-CM | POA: Diagnosis not present

## 2021-03-04 DIAGNOSIS — K3189 Other diseases of stomach and duodenum: Secondary | ICD-10-CM

## 2021-03-04 MED ORDER — SODIUM CHLORIDE 0.9 % IV SOLN: 500.0000 mL | Freq: Once | INTRAVENOUS | Status: DC

## 2021-03-04 NOTE — Progress Notes (Signed)
Pt's states no medical or surgical changes since previsit or office visit.  Vitals DT 

## 2021-03-04 NOTE — Patient Instructions (Signed)
Handout on gastritis given to you today   YOU HAD AN ENDOSCOPIC PROCEDURE TODAY AT [facility]:   Refer to the procedure report that was given to you for any specific questions about what was found during the examination.  If the procedure report does not answer your questions, please call your gastroenterologist to clarify.  If you requested that your care partner not be given the details of your procedure findings, then the procedure report has been included in a sealed envelope for you to review at your convenience later.  YOU SHOULD EXPECT: Some feelings of bloating in the abdomen. Passage of more gas than usual.  Walking can help get rid of the air that was put into your GI tract during the procedure and reduce the bloating. If you had a lower endoscopy (such as a colonoscopy or flexible sigmoidoscopy) you may notice spotting of blood in your stool or on the toilet paper. If you underwent a bowel prep for your procedure, you may not have a normal bowel movement for a few days.  Please Note:  You might notice some irritation and congestion in your nose or some drainage.  This is from the oxygen used during your procedure.  There is no need for concern and it should clear up in a day or so.  SYMPTOMS TO REPORT IMMEDIATELY:  Following upper endoscopy (EGD)  Vomiting of blood or coffee ground material  New chest pain or pain under the shoulder blades  Painful or persistently difficult swallowing  New shortness of breath  Fever of 100F or higher  Black, tarry-looking stools  For urgent or emergent issues, a gastroenterologist can be reached at any hour by calling [phone]. Do not use MyChart messaging for urgent concerns.    DIET:  We do recommend a small meal at first, but then you may proceed to your regular diet.  Drink plenty of fluids but you should avoid alcoholic beverages for 24 hours.  ACTIVITY:  You should plan to take it easy for the rest of today and you  should NOT DRIVE or use heavy machinery until tomorrow (because of the sedation medicines used during the test).    FOLLOW UP: Our staff will call the number listed on your records 48-72 hours following your procedure to check on you and address any questions or concerns that you may have regarding the information given to you following your procedure. If we do not reach you, we will leave a message.  We will attempt to reach you two times.  During this call, we will ask if you have developed any symptoms of COVID 19. If you develop any symptoms (ie: fever, flu-like symptoms, shortness of breath, cough etc.) before then, please call [phone].  If you test positive for Covid 19 in the 2 weeks post procedure, please call and report this information to [place].    If any biopsies were taken you will be contacted by phone or by letter within the next 1-3 weeks.  Please call us at [phone] if you have not heard about the biopsies in 3 weeks.    SIGNATURES/CONFIDENTIALITY: You and/or your care partner have signed paperwork which will be entered into your electronic medical record.  These signatures attest to the fact that that the information above on your After Visit Summary has been reviewed and is understood.  Full responsibility of the confidentiality of this discharge information lies with you and/or your care-partner.

## 2021-03-04 NOTE — Progress Notes (Signed)
Report given to PACU, vss 

## 2021-03-04 NOTE — Progress Notes (Signed)
1000 Robinul 0.1 mg IV given due large amount of secretions upon assessment.  MD made aware, vss 

## 2021-03-04 NOTE — Progress Notes (Signed)
Called to room to assist during endoscopic procedure.  Patient ID and intended procedure confirmed with present staff. Received instructions for my participation in the procedure from the performing physician.  

## 2021-03-04 NOTE — Op Note (Signed)
[facility] Patient Name: Frank Galvan Procedure Date: 03/04/2021 10:01 AM MRN: [id number] Endoscopist: Milus Banister , MD Age: 41 Referring MD:  Date of Birth: Oct 11, 1980 Gender: Male Account #: [id number] Procedure:                Upper GI endoscopy Indications:              Epigastric abdominal pain Medicines:                Monitored Anesthesia Care Procedure:                Pre-Anesthesia Assessment:                           - Prior to the procedure, a History and Physical                            was performed, and patient medications and                            allergies were reviewed. The patient's tolerance of                            previous anesthesia was also reviewed. The risks                            and benefits of the procedure and the sedation                            options and risks were discussed with the patient.                            All questions were answered, and informed consent                            was obtained. Prior Anticoagulants: The patient has                            taken no previous anticoagulant or antiplatelet                            agents. ASA Grade Assessment: II - A patient with                            mild systemic disease. After reviewing the risks                            and benefits, the patient was deemed in                            satisfactory condition to undergo the procedure.                           After obtaining informed consent, the endoscope was  passed under direct vision. Throughout the                            procedure, the patient's blood pressure, pulse, and                            oxygen saturations were monitored continuously. The                            Endoscope was introduced through the mouth, and                            advanced to the second part of duodenum. The upper                            GI endoscopy was accomplished  without difficulty.                            The patient tolerated the procedure well. Scope In: Scope Out: Findings:                 Minimal inflammation characterized by erythema was                            found in the gastric antrum. Biopsies were taken                            with a cold forceps for histology.                           The exam was otherwise without abnormality. Complications:            No immediate complications. Estimated blood loss:                            None. Estimated Blood Loss:     Estimated blood loss: none. Impression:               - Very mild, non-specific distal gastritis.                            Biopsied to check for H. pylori                           - The examination was otherwise normal. Recommendation:           - Patient has a contact number available for                            emergencies. The signs and symptoms of potential                            delayed complications were discussed with the                            patient. Return to normal activities tomorrow.  Written discharge instructions were provided to the                            patient.                           - Resume previous diet.                           - Continue present medications.                           - Await pathology results.                           - Cutting back on alcohol intake may improve your                            GI symptoms. Milus Banister, MD 03/04/2021 10:16:02 AM This report has been signed electronically.

## 2021-03-08 ENCOUNTER — Telehealth: Payer: Self-pay

## 2021-03-08 NOTE — Telephone Encounter (Signed)
°  Follow up Call-  Call back number 03/04/2021 01/06/2021  Post procedure Call Back phone  # [phone]  Permission to leave phone message Yes Yes  Some recent data might be hidden     Patient questions:  Do you have a fever, pain , or abdominal swelling? No. Pain Score  0 *  Have you tolerated food without any problems? Yes.    Have you been able to return to your normal activities? Yes.    Do you have any questions about your discharge instructions: Diet   No. Medications  No. Follow up visit  No.  Do you have questions or concerns about your Care? No.  Actions: * If pain score is 4 or above: No action needed, pain <4.  Have you developed a fever since your procedure? no  2.   Have you had an respiratory symptoms (SOB or cough) since your procedure? no  3.   Have you tested positive for COVID 19 since your procedure no  4.   Have you had any family members/close contacts diagnosed with the COVID 19 since your procedure?  no   If yes to any of these questions please route to Joylene John, RN and Joella Prince, RN

## 2021-03-09 ENCOUNTER — Encounter: Payer: Self-pay | Admitting: Gastroenterology

## 2021-04-06 ENCOUNTER — Other Ambulatory Visit: Payer: Self-pay | Admitting: Internal Medicine

## 2021-04-06 DIAGNOSIS — R1030 Lower abdominal pain, unspecified: Secondary | ICD-10-CM

## 2021-04-18 ENCOUNTER — Other Ambulatory Visit: Payer: Self-pay | Admitting: Emergency Medicine

## 2021-04-18 DIAGNOSIS — E785 Hyperlipidemia, unspecified: Secondary | ICD-10-CM

## 2021-07-15 ENCOUNTER — Other Ambulatory Visit: Payer: Self-pay | Admitting: Emergency Medicine

## 2021-07-15 DIAGNOSIS — E785 Hyperlipidemia, unspecified: Secondary | ICD-10-CM

## 2021-08-10 ENCOUNTER — Other Ambulatory Visit: Payer: Self-pay | Admitting: Internal Medicine

## 2021-08-10 DIAGNOSIS — R1030 Lower abdominal pain, unspecified: Secondary | ICD-10-CM

## 2021-09-08 ENCOUNTER — Other Ambulatory Visit: Payer: Self-pay | Admitting: Internal Medicine

## 2021-09-08 DIAGNOSIS — R1030 Lower abdominal pain, unspecified: Secondary | ICD-10-CM

## 2021-10-07 ENCOUNTER — Other Ambulatory Visit: Payer: Self-pay | Admitting: Gastroenterology

## 2021-10-07 DIAGNOSIS — R1030 Lower abdominal pain, unspecified: Secondary | ICD-10-CM

## 2021-10-18 ENCOUNTER — Other Ambulatory Visit: Payer: Self-pay | Admitting: Emergency Medicine

## 2021-10-18 DIAGNOSIS — E785 Hyperlipidemia, unspecified: Secondary | ICD-10-CM

## 2022-01-03 ENCOUNTER — Ambulatory Visit (INDEPENDENT_AMBULATORY_CARE_PROVIDER_SITE_OTHER): Payer: Managed Care, Other (non HMO) | Admitting: Emergency Medicine

## 2022-01-03 ENCOUNTER — Encounter: Payer: Self-pay | Admitting: Emergency Medicine

## 2022-01-03 VITALS — BP 118/74 | HR 60 | Temp 98.4°F | Ht 72.0 in | Wt 185.4 lb

## 2022-01-03 DIAGNOSIS — Z13228 Encounter for screening for other metabolic disorders: Secondary | ICD-10-CM

## 2022-01-03 DIAGNOSIS — Z1329 Encounter for screening for other suspected endocrine disorder: Secondary | ICD-10-CM

## 2022-01-03 DIAGNOSIS — Z0000 Encounter for general adult medical examination without abnormal findings: Secondary | ICD-10-CM

## 2022-01-03 DIAGNOSIS — Z130 Encounter for screening for diseases of the blood and blood-forming organs and certain disorders involving the immune mechanism: Secondary | ICD-10-CM | POA: Diagnosis not present

## 2022-01-03 DIAGNOSIS — Z8042 Family history of malignant neoplasm of prostate: Secondary | ICD-10-CM | POA: Diagnosis not present

## 2022-01-03 DIAGNOSIS — Z125 Encounter for screening for malignant neoplasm of prostate: Secondary | ICD-10-CM

## 2022-01-03 DIAGNOSIS — Z13220 Encounter for screening for lipoid disorders: Secondary | ICD-10-CM | POA: Diagnosis not present

## 2022-01-03 DIAGNOSIS — E785 Hyperlipidemia, unspecified: Secondary | ICD-10-CM

## 2022-01-03 DIAGNOSIS — Z1159 Encounter for screening for other viral diseases: Secondary | ICD-10-CM

## 2022-01-03 LAB — CBC WITH DIFFERENTIAL/PLATELET
Basophils Absolute: 0 10*3/uL (ref 0.0–0.1)
Basophils Relative: 1.2 % (ref 0.0–3.0)
Eosinophils Absolute: 0.1 10*3/uL (ref 0.0–0.7)
Eosinophils Relative: 1.6 % (ref 0.0–5.0)
HCT: 45.8 % (ref 39.0–52.0)
Hemoglobin: 15.6 g/dL (ref 13.0–17.0)
Lymphocytes Relative: 29.1 % (ref 12.0–46.0)
Lymphs Abs: 1.2 10*3/uL (ref 0.7–4.0)
MCHC: 34 g/dL (ref 30.0–36.0)
MCV: 89.2 fl (ref 78.0–100.0)
Monocytes Absolute: 0.6 10*3/uL (ref 0.1–1.0)
Monocytes Relative: 14.6 % — ABNORMAL HIGH (ref 3.0–12.0)
Neutro Abs: 2.1 10*3/uL (ref 1.4–7.7)
Neutrophils Relative %: 53.5 % (ref 43.0–77.0)
Platelets: 276 10*3/uL (ref 150.0–400.0)
RBC: 5.14 Mil/uL (ref 4.22–5.81)
RDW: 12.8 % (ref 11.5–15.5)
WBC: 4 10*3/uL (ref 4.0–10.5)

## 2022-01-03 LAB — LIPID PANEL
Cholesterol: 184 mg/dL (ref 0–200)
HDL: 64.9 mg/dL (ref >39.00)
LDL Cholesterol: 96 mg/dL (ref 0–99)
NonHDL: 119.53
Total CHOL/HDL Ratio: 3
Triglycerides: 117 mg/dL (ref 0.0–149.0)
VLDL: 23.4 mg/dL (ref 0.0–40.0)

## 2022-01-03 LAB — COMPREHENSIVE METABOLIC PANEL
ALT: 35 U/L (ref 0–53)
AST: 24 U/L (ref 0–37)
Albumin: 4.9 g/dL (ref 3.5–5.2)
Alkaline Phosphatase: 28 U/L — ABNORMAL LOW (ref 39–117)
BUN: 13 mg/dL (ref 6–23)
CO2: 31 mEq/L (ref 19–32)
Calcium: 9.6 mg/dL (ref 8.4–10.5)
Chloride: 100 mEq/L (ref 96–112)
Creatinine, Ser: 1.07 mg/dL (ref 0.40–1.50)
GFR: 87.28 mL/min (ref >60.00)
Glucose, Bld: 101 mg/dL — ABNORMAL HIGH (ref 70–99)
Potassium: 4.2 mEq/L (ref 3.5–5.1)
Sodium: 139 mEq/L (ref 135–145)
Total Bilirubin: 0.5 mg/dL (ref 0.2–1.2)
Total Protein: 7.2 g/dL (ref 6.0–8.3)

## 2022-01-03 LAB — HEMOGLOBIN A1C: Hgb A1c MFr Bld: 5.5 % (ref 4.6–6.5)

## 2022-01-03 LAB — PSA: PSA: 0.79 ng/mL (ref 0.10–4.00)

## 2022-01-03 NOTE — Progress Notes (Signed)
Frank Galvan 41 y.o.   Chief Complaint  Patient presents with   Annual Exam    No concerns    HISTORY OF PRESENT ILLNESS: This is a 41 y.o. male here for annual exam. Still smoking. Family history of prostate cancer No complaints or any other medical concerns today. Last colonoscopy and upper endoscopy reports reviewed with patient.  They showed mild gastritis and several polyps with diverticulosis and internal hemorrhoids.  HPI   Prior to Admission medications   Medication Sig Start Date End Date Taking? Authorizing Provider  famotidine (PEPCID) 20 MG tablet Take 20 mg by mouth 2 (two) times daily.   Yes [provider]  omeprazole (PRILOSEC) 40 MG capsule Take 1 capsule by mouth once daily 10/07/21  Yes Milus Banister, MD  rosuvastatin (CRESTOR) 10 MG tablet Take 1 tablet by mouth once daily 10/18/21  Yes Alondria Mousseau, Ines Bloomer, MD    No Known Allergies  Patient Active Problem List   Diagnosis Date Noted   Anal pain 03/10/2018   Elevated triglycerides with high cholesterol 05/07/2015    Past Medical History:  Diagnosis Date   Acid reflux    GERD (gastroesophageal reflux disease)    [employer]rees[employer] 01/13/2020   Perianal abscess     Past Surgical History:  Procedure Laterality Date   NO PAST SURGERIES     UPPER GASTROINTESTINAL ENDOSCOPY      Social History   Socioeconomic History   Marital status: Single    Spouse name: Not on file   Number of children: 0   Years of education: Not on file   Highest education level: Not on file  Occupational History   Occupation: Engineering  Tobacco Use   Smoking status: Every Day    Packs/day: 0.12    Years: 16.00    Total pack years: 1.92    Types: Cigarettes   Smokeless tobacco: Former    Types: Chew  Substance and Sexual Activity   Alcohol use: Yes    Alcohol/week: 0.0 standard drinks of alcohol    Comment: 6 beers a day   Drug use: Yes    Types: Marijuana    Comment: rare social   Sexual activity: Yes     Partners: Female  Other Topics Concern   Not on file  Social History Narrative   Single   No children   Engineer, site   No current exercise   Social Determinants of Health   Financial Resource Strain: Not on file  Food Insecurity: Not on file  Transportation Needs: Not on file  Physical Activity: Not on file  Stress: Not on file  Social Connections: Not on file  Intimate Partner Violence: Not on file    Family History  Problem Relation Age of Onset   Hypertension Mother    Prostate cancer Father    Stroke Maternal Grandfather    Ovarian cancer Paternal Grandmother    Prostate cancer Paternal Grandfather    Colon cancer Neg Hx    Esophageal cancer Neg Hx    Rectal cancer Neg Hx    Stomach cancer Neg Hx      Review of Systems  Constitutional: Negative.  Negative for chills and fever.  HENT: Negative.  Negative for congestion and sore throat.   Respiratory: Negative.  Negative for cough and shortness of breath.   Cardiovascular: Negative.  Negative for chest pain and palpitations.  Gastrointestinal:  Negative for abdominal pain, diarrhea, nausea and vomiting.  Genitourinary: Negative.   Musculoskeletal:  Negative.   Skin: Negative.  Negative for rash.  Neurological: Negative.  Negative for dizziness and headaches.  All other systems reviewed and are negative.  Today's Vitals   01/03/22 0943  BP: 118/74  Pulse: 60  Temp: 98.4 F (36.9 C)  TempSrc: Oral  SpO2: 97%  Weight: 185 lb 6 oz (84.1 kg)  Height: 6' (1.829 m)   Body mass index is 25.14 kg/m.   Physical Exam Vitals reviewed.  Constitutional:      Appearance: Normal appearance.  HENT:     Head: Normocephalic.     Right Ear: Tympanic membrane, ear canal and external ear normal.     Left Ear: Tympanic membrane, ear canal and external ear normal.     Mouth/Throat:     Mouth: Mucous membranes are moist.     Pharynx: Oropharynx is clear.  Eyes:     Extraocular Movements: Extraocular movements  intact.     Conjunctiva/sclera: Conjunctivae normal.     Pupils: Pupils are equal, round, and reactive to light.  Cardiovascular:     Rate and Rhythm: Normal rate and regular rhythm.     Pulses: Normal pulses.     Heart sounds: Normal heart sounds.  Pulmonary:     Effort: Pulmonary effort is normal.     Breath sounds: Normal breath sounds.  Abdominal:     Palpations: Abdomen is soft.     Tenderness: There is no abdominal tenderness.  Musculoskeletal:     Cervical back: No tenderness.  Lymphadenopathy:     Cervical: No cervical adenopathy.  Skin:    General: Skin is warm and dry.  Neurological:     General: No focal deficit present.     Mental Status: He is alert and oriented to person, place, and time.  Psychiatric:        Mood and Affect: Mood normal.        Behavior: Behavior normal.      ASSESSMENT & PLAN: Problem List Items Addressed This Visit   None Visit Diagnoses     Routine general medical examination at a health care facility    -  Primary   Dyslipidemia       Relevant Orders   Hemoglobin A1c   Lipid panel   Prostate cancer screening       Relevant Orders   PSA(Must document that pt has been informed of limitations of PSA testing.)   Family history of prostate cancer       Relevant Orders   PSA(Must document that pt has been informed of limitations of PSA testing.)   Need for hepatitis C screening test       Relevant Orders   Hepatitis C antibody screen   Screening for deficiency anemia       Relevant Orders   CBC with Differential   Screening for lipoid disorders       Relevant Orders   Lipid panel   Screening for endocrine, metabolic and immunity disorder       Relevant Orders   Comprehensive metabolic panel   Hemoglobin A1c        Modifiable risk factors discussed with patient. Anticipatory guidance according to age provided. The following topics were also discussed: Social Determinants of Health Smoking and cancer/cardiovascular risks  associated with this.  Smoking cessation advice given. Diet and nutrition and need to decrease amount of daily carbohydrate intake and increase amount of plant based protein in his diet Benefits of exercise Cancer family history review Vaccinations  reviewed and recommendations Cardiovascular risk assessment and need for blood work Mental health including depression and anxiety Fall and accident prevention  Patient Instructions  Health Maintenance, Male Adopting a healthy lifestyle and getting preventive care are important in promoting health and wellness. Ask your health care provider about: The right schedule for you to have regular tests and exams. Things you can do on your own to prevent diseases and keep yourself healthy. What should I know about diet, weight, and exercise? Eat a healthy diet  Eat a diet that includes plenty of vegetables, fruits, low-fat dairy products, and lean protein. Do not eat a lot of foods that are high in solid fats, added sugars, or sodium. Maintain a healthy weight Body mass index (BMI) is a measurement that can be used to identify possible weight problems. It estimates body fat based on height and weight. Your health care provider can help determine your BMI and help you achieve or maintain a healthy weight. Get regular exercise Get regular exercise. This is one of the most important things you can do for your health. Most adults should: Exercise for at least 150 minutes each week. The exercise should increase your heart rate and make you sweat (moderate-intensity exercise). Do strengthening exercises at least twice a week. This is in addition to the moderate-intensity exercise. Spend less time sitting. Even light physical activity can be beneficial. Watch cholesterol and blood lipids Have your blood tested for lipids and cholesterol at 39 years of age, then have this test every 5 years. You may need to have your cholesterol levels checked more often  if: Your lipid or cholesterol levels are high. You are older than 39 years of age. You are at high risk for heart disease. What should I know about cancer screening? Many types of cancers can be detected early and may often be prevented. Depending on your health history and family history, you may need to have cancer screening at various ages. This may include screening for: Colorectal cancer. Prostate cancer. Skin cancer. Lung cancer. What should I know about heart disease, diabetes, and high blood pressure? Blood pressure and heart disease High blood pressure causes heart disease and increases the risk of stroke. This is more likely to develop in people who have high blood pressure readings or are overweight. Talk with your health care provider about your target blood pressure readings. Have your blood pressure checked: Every 3-5 years if you are 35-44 years of age. Every year if you are 61 years old or older. If you are between the ages of 52 and 51 and are a current or former smoker, ask your health care provider if you should have a one-time screening for abdominal aortic aneurysm (AAA). Diabetes Have regular diabetes screenings. This checks your fasting blood sugar level. Have the screening done: Once every three years after age 61 if you are at a normal weight and have a low risk for diabetes. More often and at a younger age if you are overweight or have a high risk for diabetes. What should I know about preventing infection? Hepatitis B If you have a higher risk for hepatitis B, you should be screened for this virus. Talk with your health care provider to find out if you are at risk for hepatitis B infection. Hepatitis C Blood testing is recommended for: Everyone born from [birth year] through 1963. Anyone with known risk factors for hepatitis C. Sexually transmitted infections (STIs) You should be screened each year for STIs, including gonorrhea  and chlamydia, if: You are sexually  active and are younger than 39 years of age. You are older than 39 years of age and your health care provider tells you that you are at risk for this type of infection. Your sexual activity has changed since you were last screened, and you are at increased risk for chlamydia or gonorrhea. Ask your health care provider if you are at risk. Ask your health care provider about whether you are at high risk for HIV. Your health care provider may recommend a prescription medicine to help prevent HIV infection. If you choose to take medicine to prevent HIV, you should first get tested for HIV. You should then be tested every 3 months for as long as you are taking the medicine. Follow these instructions at home: Alcohol use Do not drink alcohol if your health care provider tells you not to drink. If you drink alcohol: Limit how much you have to 0-2 drinks a day. Know how much alcohol is in your drink. In the [place] one drink equals one 12 oz bottle of beer (355 mL), one 5 oz glass of wine (148 mL), or one 1 oz glass of hard liquor (44 mL). Lifestyle Do not use any products that contain nicotine or tobacco. These products include cigarettes, chewing tobacco, and vaping devices, such as e-cigarettes. If you need help quitting, ask your health care provider. Do not use street drugs. Do not share needles. Ask your health care provider for help if you need support or information about quitting drugs. General instructions Schedule regular health, dental, and eye exams. Stay current with your vaccines. Tell your health care provider if: You often feel depressed. You have ever been abused or do not feel safe at home. Summary Adopting a healthy lifestyle and getting preventive care are important in promoting health and wellness. Follow your health care provider's instructions about healthy diet, exercising, and getting tested or screened for diseases. Follow your health care provider's instructions on  monitoring your cholesterol and blood pressure. This information is not intended to replace advice given to you by your health care provider. Make sure you discuss any questions you have with your health care provider. Document Revised: 06/14/2020 Document Reviewed: 06/14/2020 Elsevier Patient Education  Britt, MD [facility]

## 2022-01-03 NOTE — Patient Instructions (Signed)
Health Maintenance, Male Adopting a healthy lifestyle and getting preventive care are important in promoting health and wellness. Ask your health care provider about: The right schedule for you to have regular tests and exams. Things you can do on your own to prevent diseases and keep yourself healthy. What should I know about diet, weight, and exercise? Eat a healthy diet  Eat a diet that includes plenty of vegetables, fruits, low-fat dairy products, and lean protein. Do not eat a lot of foods that are high in solid fats, added sugars, or sodium. Maintain a healthy weight Body mass index (BMI) is a measurement that can be used to identify possible weight problems. It estimates body fat based on height and weight. Your health care provider can help determine your BMI and help you achieve or maintain a healthy weight. Get regular exercise Get regular exercise. This is one of the most important things you can do for your health. Most adults should: Exercise for at least 150 minutes each week. The exercise should increase your heart rate and make you sweat (moderate-intensity exercise). Do strengthening exercises at least twice a week. This is in addition to the moderate-intensity exercise. Spend less time sitting. Even light physical activity can be beneficial. Watch cholesterol and blood lipids Have your blood tested for lipids and cholesterol at 39 years of age, then have this test every 5 years. You may need to have your cholesterol levels checked more often if: Your lipid or cholesterol levels are high. You are older than 40 years of age. You are at high risk for heart disease. What should I know about cancer screening? Many types of cancers can be detected early and may often be prevented. Depending on your health history and family history, you may need to have cancer screening at various ages. This may include screening for: Colorectal cancer. Prostate cancer. Skin cancer. Lung  cancer. What should I know about heart disease, diabetes, and high blood pressure? Blood pressure and heart disease High blood pressure causes heart disease and increases the risk of stroke. This is more likely to develop in people who have high blood pressure readings or are overweight. Talk with your health care provider about your target blood pressure readings. Have your blood pressure checked: Every 3-5 years if you are 18-39 years of age. Every year if you are 40 years old or older. If you are between the ages of 65 and 75 and are a current or former smoker, ask your health care provider if you should have a one-time screening for abdominal aortic aneurysm (AAA). Diabetes Have regular diabetes screenings. This checks your fasting blood sugar level. Have the screening done: Once every three years after age 45 if you are at a normal weight and have a low risk for diabetes. More often and at a younger age if you are overweight or have a high risk for diabetes. What should I know about preventing infection? Hepatitis B If you have a higher risk for hepatitis B, you should be screened for this virus. Talk with your health care provider to find out if you are at risk for hepatitis B infection. Hepatitis C Blood testing is recommended for: Everyone born from 1945 through 1965. Anyone with known risk factors for hepatitis C. Sexually transmitted infections (STIs) You should be screened each year for STIs, including gonorrhea and chlamydia, if: You are sexually active and are younger than 39 years of age. You are older than 39 years of age and your   health care provider tells you that you are at risk for this type of infection. Your sexual activity has changed since you were last screened, and you are at increased risk for chlamydia or gonorrhea. Ask your health care provider if you are at risk. Ask your health care provider about whether you are at high risk for HIV. Your health care provider  may recommend a prescription medicine to help prevent HIV infection. If you choose to take medicine to prevent HIV, you should first get tested for HIV. You should then be tested every 3 months for as long as you are taking the medicine. Follow these instructions at home: Alcohol use Do not drink alcohol if your health care provider tells you not to drink. If you drink alcohol: Limit how much you have to 0-2 drinks a day. Know how much alcohol is in your drink. In the [place] one drink equals one 12 oz bottle of beer (355 mL), one 5 oz glass of wine (148 mL), or one 1 oz glass of hard liquor (44 mL). Lifestyle Do not use any products that contain nicotine or tobacco. These products include cigarettes, chewing tobacco, and vaping devices, such as e-cigarettes. If you need help quitting, ask your health care provider. Do not use street drugs. Do not share needles. Ask your health care provider for help if you need support or information about quitting drugs. General instructions Schedule regular health, dental, and eye exams. Stay current with your vaccines. Tell your health care provider if: You often feel depressed. You have ever been abused or do not feel safe at home. Summary Adopting a healthy lifestyle and getting preventive care are important in promoting health and wellness. Follow your health care provider's instructions about healthy diet, exercising, and getting tested or screened for diseases. Follow your health care provider's instructions on monitoring your cholesterol and blood pressure. This information is not intended to replace advice given to you by your health care provider. Make sure you discuss any questions you have with your health care provider. Document Revised: 06/14/2020 Document Reviewed: 06/14/2020 Elsevier Patient Education  2023 [employer]  

## 2022-01-04 LAB — HEPATITIS C ANTIBODY: Hepatitis C Ab: NONREACTIVE

## 2022-01-17 ENCOUNTER — Other Ambulatory Visit: Payer: Self-pay | Admitting: Emergency Medicine

## 2022-01-17 DIAGNOSIS — E785 Hyperlipidemia, unspecified: Secondary | ICD-10-CM

## 2022-02-03 ENCOUNTER — Other Ambulatory Visit: Payer: Self-pay | Admitting: Gastroenterology

## 2022-02-03 DIAGNOSIS — R1030 Lower abdominal pain, unspecified: Secondary | ICD-10-CM

## 2022-02-07 NOTE — Telephone Encounter (Signed)
This is a patient of Dr Ardis Hughs.  Please advise refills as you are DOD am.  Thank you

## 2022-02-09 ENCOUNTER — Other Ambulatory Visit: Payer: Self-pay | Admitting: Gastroenterology

## 2022-02-09 DIAGNOSIS — R1030 Lower abdominal pain, unspecified: Secondary | ICD-10-CM

## 2022-03-21 ENCOUNTER — Other Ambulatory Visit: Payer: Self-pay | Admitting: Gastroenterology

## 2022-03-21 DIAGNOSIS — R1030 Lower abdominal pain, unspecified: Secondary | ICD-10-CM

## 2022-04-20 ENCOUNTER — Other Ambulatory Visit: Payer: Self-pay | Admitting: Gastroenterology

## 2022-04-20 ENCOUNTER — Other Ambulatory Visit: Payer: Self-pay | Admitting: Emergency Medicine

## 2022-04-20 DIAGNOSIS — E785 Hyperlipidemia, unspecified: Secondary | ICD-10-CM

## 2022-04-20 DIAGNOSIS — R1030 Lower abdominal pain, unspecified: Secondary | ICD-10-CM

## 2022-07-19 ENCOUNTER — Other Ambulatory Visit: Payer: Self-pay | Admitting: Emergency Medicine

## 2022-07-19 DIAGNOSIS — E785 Hyperlipidemia, unspecified: Secondary | ICD-10-CM

## 2022-10-03 DIAGNOSIS — R21 Rash and other nonspecific skin eruption: Secondary | ICD-10-CM | POA: Diagnosis not present

## 2022-10-03 DIAGNOSIS — T148XXA Other injury of unspecified body region, initial encounter: Secondary | ICD-10-CM | POA: Diagnosis not present

## 2022-10-19 ENCOUNTER — Other Ambulatory Visit: Payer: Self-pay | Admitting: Emergency Medicine

## 2022-10-19 DIAGNOSIS — E785 Hyperlipidemia, unspecified: Secondary | ICD-10-CM

## 2023-01-16 ENCOUNTER — Other Ambulatory Visit: Payer: Self-pay | Admitting: Emergency Medicine

## 2023-01-16 DIAGNOSIS — E785 Hyperlipidemia, unspecified: Secondary | ICD-10-CM

## 2023-01-18 ENCOUNTER — Ambulatory Visit: Payer: BC Managed Care – PPO | Admitting: Emergency Medicine

## 2023-01-18 ENCOUNTER — Encounter: Payer: Self-pay | Admitting: Emergency Medicine

## 2023-01-18 VITALS — BP 112/78 | HR 79 | Temp 98.3°F | Ht 72.0 in | Wt 186.0 lb

## 2023-01-18 DIAGNOSIS — Z130 Encounter for screening for diseases of the blood and blood-forming organs and certain disorders involving the immune mechanism: Secondary | ICD-10-CM | POA: Diagnosis not present

## 2023-01-18 DIAGNOSIS — Z125 Encounter for screening for malignant neoplasm of prostate: Secondary | ICD-10-CM | POA: Diagnosis not present

## 2023-01-18 DIAGNOSIS — Z13220 Encounter for screening for lipoid disorders: Secondary | ICD-10-CM | POA: Diagnosis not present

## 2023-01-18 DIAGNOSIS — Z1329 Encounter for screening for other suspected endocrine disorder: Secondary | ICD-10-CM

## 2023-01-18 DIAGNOSIS — D229 Melanocytic nevi, unspecified: Secondary | ICD-10-CM

## 2023-01-18 DIAGNOSIS — Z8042 Family history of malignant neoplasm of prostate: Secondary | ICD-10-CM | POA: Diagnosis not present

## 2023-01-18 DIAGNOSIS — Z0000 Encounter for general adult medical examination without abnormal findings: Secondary | ICD-10-CM

## 2023-01-18 DIAGNOSIS — Z13228 Encounter for screening for other metabolic disorders: Secondary | ICD-10-CM | POA: Diagnosis not present

## 2023-01-18 LAB — COMPREHENSIVE METABOLIC PANEL
ALT: 35 U/L (ref 0–53)
AST: 25 U/L (ref 0–37)
Albumin: 4.6 g/dL (ref 3.5–5.2)
Alkaline Phosphatase: 27 U/L — ABNORMAL LOW (ref 39–117)
BUN: 14 mg/dL (ref 6–23)
CO2: 28 meq/L (ref 19–32)
Calcium: 9.2 mg/dL (ref 8.4–10.5)
Chloride: 103 meq/L (ref 96–112)
Creatinine, Ser: 1.06 mg/dL (ref 0.40–1.50)
GFR: 87.63 mL/min (ref >60.00)
Glucose, Bld: 91 mg/dL (ref 70–99)
Potassium: 4.3 meq/L (ref 3.5–5.1)
Sodium: 140 meq/L (ref 135–145)
Total Bilirubin: 0.6 mg/dL (ref 0.2–1.2)
Total Protein: 6.8 g/dL (ref 6.0–8.3)

## 2023-01-18 LAB — CBC WITH DIFFERENTIAL/PLATELET
Basophils Absolute: 0 10*3/uL (ref 0.0–0.1)
Basophils Relative: 1.1 % (ref 0.0–3.0)
Eosinophils Absolute: 0.1 10*3/uL (ref 0.0–0.7)
Eosinophils Relative: 3.5 % (ref 0.0–5.0)
HCT: 45.6 % (ref 39.0–52.0)
Hemoglobin: 15.4 g/dL (ref 13.0–17.0)
Lymphocytes Relative: 36.7 % (ref 12.0–46.0)
Lymphs Abs: 1.2 10*3/uL (ref 0.7–4.0)
MCHC: 33.8 g/dL (ref 30.0–36.0)
MCV: 89.9 fL (ref 78.0–100.0)
Monocytes Absolute: 0.4 10*3/uL (ref 0.1–1.0)
Monocytes Relative: 12.2 % — ABNORMAL HIGH (ref 3.0–12.0)
Neutro Abs: 1.5 10*3/uL (ref 1.4–7.7)
Neutrophils Relative %: 46.5 % (ref 43.0–77.0)
Platelets: 254 10*3/uL (ref 150.0–400.0)
RBC: 5.07 Mil/uL (ref 4.22–5.81)
RDW: 12.9 % (ref 11.5–15.5)
WBC: 3.3 10*3/uL — ABNORMAL LOW (ref 4.0–10.5)

## 2023-01-18 LAB — LIPID PANEL
Cholesterol: 133 mg/dL (ref 0–200)
HDL: 48.1 mg/dL (ref >39.00)
LDL Cholesterol: 60 mg/dL (ref 0–99)
NonHDL: 84.79
Total CHOL/HDL Ratio: 3
Triglycerides: 125 mg/dL (ref 0.0–149.0)
VLDL: 25 mg/dL (ref 0.0–40.0)

## 2023-01-18 LAB — HEMOGLOBIN A1C: Hgb A1c MFr Bld: 5.5 % (ref 4.6–6.5)

## 2023-01-18 LAB — PSA: PSA: 0.69 ng/mL (ref 0.10–4.00)

## 2023-01-18 NOTE — Progress Notes (Signed)
Frank Galvan 42 y.o.   Chief Complaint  Patient presents with   Annual Exam    Patient here for physical states no other concerns     HISTORY OF PRESENT ILLNESS: This is a 42 y.o. male here for annual exam Requesting referral to dermatology for multiple skin moles on his back Still smoking No other complaints or medical concerns today.  HPI   Prior to Admission medications   Medication Sig Start Date End Date Taking? Authorizing Provider  famotidine (PEPCID) 20 MG tablet Take 20 mg by mouth 2 (two) times daily.   Yes [provider]  omeprazole (PRILOSEC) 40 MG capsule TAKE 1 CAPSULE BY MOUTH ONCE DAILY . APPOINTMENT REQUIRED FOR FUTURE REFILLS 04/21/22  Yes Hilarie Fredrickson, MD  rosuvastatin (CRESTOR) 10 MG tablet Take 1 tablet by mouth once daily 01/16/23  Yes Yamel Bale, Eilleen Kempf, MD    No Known Allergies  Patient Active Problem List   Diagnosis Date Noted   Elevated triglycerides with high cholesterol 05/07/2015    Past Medical History:  Diagnosis Date   Acid reflux    GERD (gastroesophageal reflux disease)    Phreesia 01/13/2020   Perianal abscess     Past Surgical History:  Procedure Laterality Date   NO PAST SURGERIES     UPPER GASTROINTESTINAL ENDOSCOPY      Social History   Socioeconomic History   Marital status: Single    Spouse name: Not on file   Number of children: 0   Years of education: Not on file   Highest education level: Bachelor's degree (e.g., BA, AB, BS)  Occupational History   Occupation: Engineering  Tobacco Use   Smoking status: Every Day    Current packs/day: 0.12    Average packs/day: 0.1 packs/day for 16.0 years (1.9 ttl pk-yrs)    Types: Cigarettes   Smokeless tobacco: Former    Types: Chew  Substance and Sexual Activity   Alcohol use: Yes    Alcohol/week: 0.0 standard drinks of alcohol    Comment: 6 beers a day   Drug use: Yes    Types: Marijuana    Comment: rare social   Sexual activity: Yes    Partners:  Female  Other Topics Concern   Not on file  Social History Narrative   Single   No children   Physicist, medical   No current exercise   Social Drivers of Health   Financial Resource Strain: Low Risk  (01/17/2023)   Overall Financial Resource Strain (CARDIA)    Difficulty of Paying Living Expenses: Not hard at all  Food Insecurity: No Food Insecurity (01/17/2023)   Hunger Vital Sign    Worried About Running Out of Food in the Last Year: Never true    Ran Out of Food in the Last Year: Never true  Transportation Needs: No Transportation Needs (01/17/2023)   PRAPARE - Administrator, Civil Service (Medical): No    Lack of Transportation (Non-Medical): No  Physical Activity: Insufficiently Active (01/17/2023)   Exercise Vital Sign    Days of Exercise per Week: 4 days    Minutes of Exercise per Session: 30 min  Stress: No Stress Concern Present (01/17/2023)   [employer] of Occupational Health - Occupational Stress Questionnaire    Feeling of Stress : Only a little  Social Connections: Unknown (01/17/2023)   Social Connection and Isolation Panel [NHANES]    Frequency of Communication with Friends and Family: Once a week  Frequency of Social Gatherings with Friends and Family: Patient declined    Attends Religious Services: Patient declined    Database administrator or Organizations: No    Attends Engineer, structural: Not on file    Marital Status: Never married  Catering manager Violence: Not on file    Family History  Problem Relation Age of Onset   Hypertension Mother    Prostate cancer Father    Stroke Maternal Grandfather    Ovarian cancer Paternal Grandmother    Prostate cancer Paternal Grandfather    Colon cancer Neg Hx    Esophageal cancer Neg Hx    Rectal cancer Neg Hx    Stomach cancer Neg Hx      Review of Systems  Constitutional: Negative.  Negative for chills and fever.  HENT: Negative.  Negative for congestion and sore  throat.   Respiratory: Negative.  Negative for cough and shortness of breath.   Cardiovascular: Negative.  Negative for chest pain and palpitations.  Gastrointestinal:  Negative for abdominal pain, diarrhea, nausea and vomiting.  Genitourinary: Negative.  Negative for dysuria and hematuria.  Skin: Negative.  Negative for rash.  Neurological: Negative.  Negative for dizziness and headaches.  All other systems reviewed and are negative.   Vitals:   01/18/23 0819  BP: 112/78  Pulse: 79  Temp: 98.3 F (36.8 C)  SpO2: 99%    Physical Exam Vitals reviewed.  Constitutional:      Appearance: Normal appearance.  HENT:     Head: Normocephalic.     Right Ear: Tympanic membrane, ear canal and external ear normal.     Left Ear: Tympanic membrane, ear canal and external ear normal.     Mouth/Throat:     Mouth: Mucous membranes are moist.     Pharynx: Oropharynx is clear.  Eyes:     Extraocular Movements: Extraocular movements intact.     Conjunctiva/sclera: Conjunctivae normal.     Pupils: Pupils are equal, round, and reactive to light.  Cardiovascular:     Rate and Rhythm: Normal rate and regular rhythm.     Pulses: Normal pulses.     Heart sounds: Normal heart sounds.  Pulmonary:     Effort: Pulmonary effort is normal.     Breath sounds: Normal breath sounds.  Abdominal:     Palpations: Abdomen is soft.     Tenderness: There is no abdominal tenderness.  Musculoskeletal:     Cervical back: No tenderness.  Lymphadenopathy:     Cervical: No cervical adenopathy.  Skin:    General: Skin is warm and dry.     Capillary Refill: Capillary refill takes less than 2 seconds.  Neurological:     General: No focal deficit present.     Mental Status: He is alert and oriented to person, place, and time.  Psychiatric:        Mood and Affect: Mood normal.        Behavior: Behavior normal.      ASSESSMENT & PLAN: Problem List Items Addressed This Visit       Other   Family history  of prostate cancer   Relevant Orders   PSA(Must document that pt has been informed of limitations of PSA testing.)   Other Visit Diagnoses       Routine general medical examination at a health care facility    -  Primary   Relevant Orders   CBC with Differential   Comprehensive metabolic panel   Hemoglobin  A1c   Lipid panel   PSA(Must document that pt has been informed of limitations of PSA testing.)     Multiple atypical skin moles       Relevant Orders   Ambulatory referral to Dermatology     Screening for prostate cancer       Relevant Orders   PSA(Must document that pt has been informed of limitations of PSA testing.)     Screening for deficiency anemia       Relevant Orders   CBC with Differential     Screening for lipoid disorders       Relevant Orders   Lipid panel     Screening for endocrine, metabolic and immunity disorder       Relevant Orders   Comprehensive metabolic panel   Hemoglobin A1c      Modifiable risk factors discussed with patient. Anticipatory guidance according to age provided. The following topics were also discussed: Social Determinants of Health Smoking and cardiovascular/cancer risks associated with this condition Diet and nutrition Benefits of exercise Cancer family history review Vaccinations review and recommendations Cardiovascular risk assessment and need for blood work Mental health including depression and anxiety Fall and accident prevention  Patient Instructions  Health Maintenance, Male Adopting a healthy lifestyle and getting preventive care are important in promoting health and wellness. Ask your health care provider about: The right schedule for you to have regular tests and exams. Things you can do on your own to prevent diseases and keep yourself healthy. What should I know about diet, weight, and exercise? Eat a healthy diet  Eat a diet that includes plenty of vegetables, fruits, low-fat dairy products, and lean  protein. Do not eat a lot of foods that are high in solid fats, added sugars, or sodium. Maintain a healthy weight Body mass index (BMI) is a measurement that can be used to identify possible weight problems. It estimates body fat based on height and weight. Your health care provider can help determine your BMI and help you achieve or maintain a healthy weight. Get regular exercise Get regular exercise. This is one of the most important things you can do for your health. Most adults should: Exercise for at least 150 minutes each week. The exercise should increase your heart rate and make you sweat (moderate-intensity exercise). Do strengthening exercises at least twice a week. This is in addition to the moderate-intensity exercise. Spend less time sitting. Even light physical activity can be beneficial. Watch cholesterol and blood lipids Have your blood tested for lipids and cholesterol at 40 years of age, then have this test every 5 years. You may need to have your cholesterol levels checked more often if: Your lipid or cholesterol levels are high. You are older than 40 years of age. You are at high risk for heart disease. What should I know about cancer screening? Many types of cancers can be detected early and may often be prevented. Depending on your health history and family history, you may need to have cancer screening at various ages. This may include screening for: Colorectal cancer. Prostate cancer. Skin cancer. Lung cancer. What should I know about heart disease, diabetes, and high blood pressure? Blood pressure and heart disease High blood pressure causes heart disease and increases the risk of stroke. This is more likely to develop in people who have high blood pressure readings or are overweight. Talk with your health care provider about your target blood pressure readings. Have your blood pressure checked: Every  3-5 years if you are 64-14 years of age. Every year if you are  91 years old or older. If you are between the ages of 43 and 28 and are a current or former smoker, ask your health care provider if you should have a one-time screening for abdominal aortic aneurysm (AAA). Diabetes Have regular diabetes screenings. This checks your fasting blood sugar level. Have the screening done: Once every three years after age 31 if you are at a normal weight and have a low risk for diabetes. More often and at a younger age if you are overweight or have a high risk for diabetes. What should I know about preventing infection? Hepatitis B If you have a higher risk for hepatitis B, you should be screened for this virus. Talk with your health care provider to find out if you are at risk for hepatitis B infection. Hepatitis C Blood testing is recommended for: Everyone born from [birth year] through 1965. Anyone with known risk factors for hepatitis C. Sexually transmitted infections (STIs) You should be screened each year for STIs, including gonorrhea and chlamydia, if: You are sexually active and are younger than 40 years of age. You are older than 40 years of age and your health care provider tells you that you are at risk for this type of infection. Your sexual activity has changed since you were last screened, and you are at increased risk for chlamydia or gonorrhea. Ask your health care provider if you are at risk. Ask your health care provider about whether you are at high risk for HIV. Your health care provider may recommend a prescription medicine to help prevent HIV infection. If you choose to take medicine to prevent HIV, you should first get tested for HIV. You should then be tested every 3 months for as long as you are taking the medicine. Follow these instructions at home: Alcohol use Do not drink alcohol if your health care provider tells you not to drink. If you drink alcohol: Limit how much you have to 0-2 drinks a day. Know how much alcohol is in your drink. In the  [place] one drink equals one 12 oz bottle of beer (355 mL), one 5 oz glass of wine (148 mL), or one 1 oz glass of hard liquor (44 mL). Lifestyle Do not use any products that contain nicotine or tobacco. These products include cigarettes, chewing tobacco, and vaping devices, such as e-cigarettes. If you need help quitting, ask your health care provider. Do not use street drugs. Do not share needles. Ask your health care provider for help if you need support or information about quitting drugs. General instructions Schedule regular health, dental, and eye exams. Stay current with your vaccines. Tell your health care provider if: You often feel depressed. You have ever been abused or do not feel safe at home. Summary Adopting a healthy lifestyle and getting preventive care are important in promoting health and wellness. Follow your health care provider's instructions about healthy diet, exercising, and getting tested or screened for diseases. Follow your health care provider's instructions on monitoring your cholesterol and blood pressure. This information is not intended to replace advice given to you by your health care provider. Make sure you discuss any questions you have with your health care provider. Document Revised: 06/14/2020 Document Reviewed: 06/14/2020 Elsevier Patient Education  2024 Elsevier Inc.      Edwina Barth, MD [facility]

## 2023-01-18 NOTE — Patient Instructions (Signed)
Health Maintenance, Male Adopting a healthy lifestyle and getting preventive care are important in promoting health and wellness. Ask your health care provider about: The right schedule for you to have regular tests and exams. Things you can do on your own to prevent diseases and keep yourself healthy. What should I know about diet, weight, and exercise? Eat a healthy diet  Eat a diet that includes plenty of vegetables, fruits, low-fat dairy products, and lean protein. Do not eat a lot of foods that are high in solid fats, added sugars, or sodium. Maintain a healthy weight Body mass index (BMI) is a measurement that can be used to identify possible weight problems. It estimates body fat based on height and weight. Your health care provider can help determine your BMI and help you achieve or maintain a healthy weight. Get regular exercise Get regular exercise. This is one of the most important things you can do for your health. Most adults should: Exercise for at least 150 minutes each week. The exercise should increase your heart rate and make you sweat (moderate-intensity exercise). Do strengthening exercises at least twice a week. This is in addition to the moderate-intensity exercise. Spend less time sitting. Even light physical activity can be beneficial. Watch cholesterol and blood lipids Have your blood tested for lipids and cholesterol at 40 years of age, then have this test every 5 years. You may need to have your cholesterol levels checked more often if: Your lipid or cholesterol levels are high. You are older than 40 years of age. You are at high risk for heart disease. What should I know about cancer screening? Many types of cancers can be detected early and may often be prevented. Depending on your health history and family history, you may need to have cancer screening at various ages. This may include screening for: Colorectal cancer. Prostate cancer. Skin cancer. Lung  cancer. What should I know about heart disease, diabetes, and high blood pressure? Blood pressure and heart disease High blood pressure causes heart disease and increases the risk of stroke. This is more likely to develop in people who have high blood pressure readings or are overweight. Talk with your health care provider about your target blood pressure readings. Have your blood pressure checked: Every 3-5 years if you are 18-39 years of age. Every year if you are 40 years old or older. If you are between the ages of 65 and 75 and are a current or former smoker, ask your health care provider if you should have a one-time screening for abdominal aortic aneurysm (AAA). Diabetes Have regular diabetes screenings. This checks your fasting blood sugar level. Have the screening done: Once every three years after age 45 if you are at a normal weight and have a low risk for diabetes. More often and at a younger age if you are overweight or have a high risk for diabetes. What should I know about preventing infection? Hepatitis B If you have a higher risk for hepatitis B, you should be screened for this virus. Talk with your health care provider to find out if you are at risk for hepatitis B infection. Hepatitis C Blood testing is recommended for: Everyone born from 1945 through 1965. Anyone with known risk factors for hepatitis C. Sexually transmitted infections (STIs) You should be screened each year for STIs, including gonorrhea and chlamydia, if: You are sexually active and are younger than 40 years of age. You are older than 40 years of age and your   health care provider tells you that you are at risk for this type of infection. Your sexual activity has changed since you were last screened, and you are at increased risk for chlamydia or gonorrhea. Ask your health care provider if you are at risk. Ask your health care provider about whether you are at high risk for HIV. Your health care provider  may recommend a prescription medicine to help prevent HIV infection. If you choose to take medicine to prevent HIV, you should first get tested for HIV. You should then be tested every 3 months for as long as you are taking the medicine. Follow these instructions at home: Alcohol use Do not drink alcohol if your health care provider tells you not to drink. If you drink alcohol: Limit how much you have to 0-2 drinks a day. Know how much alcohol is in your drink. In the [place] one drink equals one 12 oz bottle of beer (355 mL), one 5 oz glass of wine (148 mL), or one 1 oz glass of hard liquor (44 mL). Lifestyle Do not use any products that contain nicotine or tobacco. These products include cigarettes, chewing tobacco, and vaping devices, such as e-cigarettes. If you need help quitting, ask your health care provider. Do not use street drugs. Do not share needles. Ask your health care provider for help if you need support or information about quitting drugs. General instructions Schedule regular health, dental, and eye exams. Stay current with your vaccines. Tell your health care provider if: You often feel depressed. You have ever been abused or do not feel safe at home. Summary Adopting a healthy lifestyle and getting preventive care are important in promoting health and wellness. Follow your health care provider's instructions about healthy diet, exercising, and getting tested or screened for diseases. Follow your health care provider's instructions on monitoring your cholesterol and blood pressure. This information is not intended to replace advice given to you by your health care provider. Make sure you discuss any questions you have with your health care provider. Document Revised: 06/14/2020 Document Reviewed: 06/14/2020 Elsevier Patient Education  2024 Elsevier Inc.  

## 2023-04-16 ENCOUNTER — Encounter: Payer: Self-pay | Admitting: Dermatology

## 2023-04-16 ENCOUNTER — Ambulatory Visit (INDEPENDENT_AMBULATORY_CARE_PROVIDER_SITE_OTHER): Payer: BC Managed Care – PPO | Admitting: Dermatology

## 2023-04-16 VITALS — BP 128/84

## 2023-04-16 DIAGNOSIS — D485 Neoplasm of uncertain behavior of skin: Secondary | ICD-10-CM

## 2023-04-16 DIAGNOSIS — W908XXA Exposure to other nonionizing radiation, initial encounter: Secondary | ICD-10-CM

## 2023-04-16 DIAGNOSIS — D229 Melanocytic nevi, unspecified: Secondary | ICD-10-CM

## 2023-04-16 DIAGNOSIS — L821 Other seborrheic keratosis: Secondary | ICD-10-CM

## 2023-04-16 DIAGNOSIS — L578 Other skin changes due to chronic exposure to nonionizing radiation: Secondary | ICD-10-CM

## 2023-04-16 DIAGNOSIS — L508 Other urticaria: Secondary | ICD-10-CM

## 2023-04-16 DIAGNOSIS — D492 Neoplasm of unspecified behavior of bone, soft tissue, and skin: Secondary | ICD-10-CM | POA: Diagnosis not present

## 2023-04-16 DIAGNOSIS — L282 Other prurigo: Secondary | ICD-10-CM

## 2023-04-16 DIAGNOSIS — L814 Other melanin hyperpigmentation: Secondary | ICD-10-CM

## 2023-04-16 DIAGNOSIS — D1801 Hemangioma of skin and subcutaneous tissue: Secondary | ICD-10-CM

## 2023-04-16 DIAGNOSIS — Z1283 Encounter for screening for malignant neoplasm of skin: Secondary | ICD-10-CM | POA: Diagnosis not present

## 2023-04-16 MED ORDER — TRIAMCINOLONE ACETONIDE 0.1 % EX CREA: 1.0000 | TOPICAL_CREAM | Freq: Two times a day (BID) | CUTANEOUS | 1 refills | Status: AC

## 2023-04-16 NOTE — Progress Notes (Signed)
   New Patient Visit   Subjective  Frank Galvan is a 43 y.o. male who presents for the following: Skin Cancer Screening and Upper Body Skin Exam - No history of skin cancer. No family history of Melanoma. He also has a rash of his arms and legs - he is no sure if it's eczema or psoriasis. It came up after he was bitten by a tick in August.  The patient presents for Upper Body Skin Exam (UBSE) for skin cancer screening and mole check. The patient has spots, moles and lesions to be evaluated, some may be new or changing.  The following portions of the chart were reviewed this encounter and updated as appropriate: medications, allergies, medical history  Review of Systems:  No other skin or systemic complaints except as noted in HPI or Assessment and Plan.  Objective  Well appearing patient in no apparent distress; mood and affect are within normal limits.  All skin waist up examined. Relevant physical exam findings are noted in the Assessment and Plan.  Right Alar Crease 0.5 cm crust  Assessment & Plan   NEOPLASM OF UNCERTAIN BEHAVIOR OF SKIN Right Alar Crease Benign appearing. Recheck with any changes. Skin cancer screening performed today.  Actinic Damage - Chronic condition, secondary to cumulative UV/sun exposure - diffuse scaly erythematous macules with underlying dyspigmentation - Recommend daily broad spectrum sunscreen SPF 30+ to sun-exposed areas, reapply every 2 hours as needed.  - Staying in the shade or wearing long sleeves, sun glasses (UVA+UVB protection) and wide brim hats (4-inch brim around the entire circumference of the hat) are also recommended for sun protection.  - Call for new or changing lesions.  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 It was explained that this condition is a common skin reaction to bites from various insects, such as fleas, mosquitoes, or bedbugs, and is characterized by itchy, raised, red or skin-colored bumps. The patient was reassured that while the condition is typically self-limiting and resolves once the insect bites heal, the itching can be quite bothersome. Management strategies were discussed, including the use of antihistamines to relieve itching and topical corticosteroids to reduce inflammation. The patient was advised to avoid scratching the affected areas to prevent secondary infection  Treatment Plan: TMC 0.1% cream twice daily to affected areas of rash   Return in about 1 month (around 05/17/2023) for Follow up.  I, Joanie Coddington, CMA, am acting as scribe for Gwenith Daily, MD .   Documentation: I have reviewed the above documentation for accuracy and completeness, and I agree with the above.  Gwenith Daily, MD

## 2023-04-16 NOTE — Patient Instructions (Signed)
 Topical steroids (such as triamcinolone, fluocinolone, fluocinonide, mometasone, clobetasol, halobetasol, betamethasone, hydrocortisone) can cause thinning and lightening of the skin if they are used for too long in the same area. Your physician has selected the right strength medicine for your problem and area affected on the body. Please use your medication only as directed by your physician to prevent side effects.    Important Information  Due to recent changes in healthcare laws, you may see results of your pathology and/or laboratory studies on MyChart before the doctors have had a chance to review them. We understand that in some cases there may be results that are confusing or concerning to you. Please understand that not all results are received at the same time and often the doctors may need to interpret multiple results in order to provide you with the best plan of care or course of treatment. Therefore, we ask that you please give [place] 2 business days to thoroughly review all your results before contacting the office for clarification. Should we see a critical lab result, you will be contacted sooner.   If You Need Anything After Your Visit  If you have any questions or concerns for your doctor, please call our main line at [phone] If no one answers, please leave a voicemail as directed and we will return your call as soon as possible. Messages left after 4 pm will be answered the following business day.   You may also send [place] a message via MyChart. We typically respond to MyChart messages within 1-2 business days.  For prescription refills, please ask your pharmacy to contact our office. Our fax number is [phone].  If you have an urgent issue when the clinic is closed that cannot wait until the next business day, you can page your doctor at the number below.    Please note that while we do our best to be available for urgent issues outside of office hours, we are not  available 24/7.   If you have an urgent issue and are unable to reach [place], you may choose to seek medical care at your doctor's office, retail clinic, urgent care center, or emergency room.  If you have a medical emergency, please immediately call 911 or go to the emergency department. In the event of inclement weather, please call our main line at [phone] for an update on the status of any delays or closures.  Dermatology Medication Tips: Please keep the boxes that topical medications come in in order to help keep track of the instructions about where and how to use these. Pharmacies typically print the medication instructions only on the boxes and not directly on the medication tubes.   If your medication is too expensive, please contact our office at [phone] or send [place] a message through MyChart.   We are unable to tell what your co-pay for medications will be in advance as this is different depending on your insurance coverage. However, we may be able to find a substitute medication at lower cost or fill out paperwork to get insurance to cover a needed medication.   If a prior authorization is required to get your medication covered by your insurance company, please allow [place] 1-2 business days to complete this process.  Drug prices often vary depending on where the prescription is filled and some pharmacies may offer cheaper prices.  The website www.goodrx.com contains coupons for medications through different pharmacies. The prices here do not account for what  the cost may be with help from insurance (it may be cheaper with your insurance), but the website can give you the price if you did not use any insurance.  - You can print the associated coupon and take it with your prescription to the pharmacy.  - You may also stop by our office during regular business hours and pick up a GoodRx coupon card.  - If you need your prescription sent electronically to a different pharmacy, notify  our office through [facility] or by phone at [phone]

## 2023-04-19 ENCOUNTER — Other Ambulatory Visit: Payer: Self-pay | Admitting: Emergency Medicine

## 2023-04-19 ENCOUNTER — Other Ambulatory Visit: Payer: Self-pay | Admitting: Internal Medicine

## 2023-04-19 DIAGNOSIS — R1030 Lower abdominal pain, unspecified: Secondary | ICD-10-CM

## 2023-04-19 DIAGNOSIS — E785 Hyperlipidemia, unspecified: Secondary | ICD-10-CM

## 2023-04-24 ENCOUNTER — Other Ambulatory Visit: Payer: Self-pay | Admitting: Internal Medicine

## 2023-04-24 DIAGNOSIS — R1030 Lower abdominal pain, unspecified: Secondary | ICD-10-CM

## 2023-05-09 ENCOUNTER — Other Ambulatory Visit: Payer: Self-pay | Admitting: Internal Medicine

## 2023-05-09 DIAGNOSIS — R1030 Lower abdominal pain, unspecified: Secondary | ICD-10-CM

## 2023-05-17 ENCOUNTER — Encounter: Payer: Self-pay | Admitting: Dermatology

## 2023-05-17 ENCOUNTER — Ambulatory Visit: Admitting: Dermatology

## 2023-05-17 VITALS — BP 124/77 | HR 61

## 2023-05-17 DIAGNOSIS — L282 Other prurigo: Secondary | ICD-10-CM

## 2023-05-17 DIAGNOSIS — L810 Postinflammatory hyperpigmentation: Secondary | ICD-10-CM

## 2023-05-17 NOTE — Progress Notes (Signed)
   Follow-Up Visit   Subjective  Frank Galvan is a 43 y.o. male who presents for the following: following up from papular urticaria. Pt here following up after rash on arms and legs. Pt was given TMC cream which seemed to help. He still has a couple spots that pop up. He feels like the spots have improved a lot.  The following portions of the chart were reviewed this encounter and updated as appropriate: medications, allergies, medical history  Review of Systems:  No other skin or systemic complaints except as noted in HPI or Assessment and Plan.  Objective  Well appearing patient in no apparent distress; mood and affect are within normal limits.  A focused examination was performed of the following areas: Arms and legs  Relevant exam findings are noted in the Assessment and Plan.    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 Recommend daily sun protection including sunscreen SPF 30+ to sun-exposed areas. - Recommend treating any itchy or red areas on the skin quickly to prevent new areas of PIH. Treating with prescription medicines such as hydroquinone may help fade dark spots faster.    Treatment Plan:  Monitor for changes  Return if symptoms worsen or fail to improve.  I, Tillie Fantasia, CMA, am acting as scribe for Gwenith Daily, MD.   Documentation: I have reviewed the above documentation for accuracy and completeness, and I agree with the above.  Gwenith Daily, MD

## 2023-05-17 NOTE — Patient Instructions (Signed)

## 2023-10-30 DIAGNOSIS — M9903 Segmental and somatic dysfunction of lumbar region: Secondary | ICD-10-CM | POA: Diagnosis not present

## 2023-10-30 DIAGNOSIS — M9902 Segmental and somatic dysfunction of thoracic region: Secondary | ICD-10-CM | POA: Diagnosis not present

## 2023-10-30 DIAGNOSIS — M9906 Segmental and somatic dysfunction of lower extremity: Secondary | ICD-10-CM | POA: Diagnosis not present

## 2023-10-30 DIAGNOSIS — M9905 Segmental and somatic dysfunction of pelvic region: Secondary | ICD-10-CM | POA: Diagnosis not present

## 2023-11-01 DIAGNOSIS — M9902 Segmental and somatic dysfunction of thoracic region: Secondary | ICD-10-CM | POA: Diagnosis not present

## 2023-11-01 DIAGNOSIS — M9905 Segmental and somatic dysfunction of pelvic region: Secondary | ICD-10-CM | POA: Diagnosis not present

## 2023-11-01 DIAGNOSIS — M9903 Segmental and somatic dysfunction of lumbar region: Secondary | ICD-10-CM | POA: Diagnosis not present

## 2023-11-01 DIAGNOSIS — M9906 Segmental and somatic dysfunction of lower extremity: Secondary | ICD-10-CM | POA: Diagnosis not present

## 2023-11-06 DIAGNOSIS — M9902 Segmental and somatic dysfunction of thoracic region: Secondary | ICD-10-CM | POA: Diagnosis not present

## 2023-11-06 DIAGNOSIS — M9906 Segmental and somatic dysfunction of lower extremity: Secondary | ICD-10-CM | POA: Diagnosis not present

## 2023-11-06 DIAGNOSIS — M9903 Segmental and somatic dysfunction of lumbar region: Secondary | ICD-10-CM | POA: Diagnosis not present

## 2023-11-06 DIAGNOSIS — M9905 Segmental and somatic dysfunction of pelvic region: Secondary | ICD-10-CM | POA: Diagnosis not present

## 2023-11-08 DIAGNOSIS — M9906 Segmental and somatic dysfunction of lower extremity: Secondary | ICD-10-CM | POA: Diagnosis not present

## 2023-11-08 DIAGNOSIS — M9905 Segmental and somatic dysfunction of pelvic region: Secondary | ICD-10-CM | POA: Diagnosis not present

## 2023-11-08 DIAGNOSIS — M9902 Segmental and somatic dysfunction of thoracic region: Secondary | ICD-10-CM | POA: Diagnosis not present

## 2023-11-08 DIAGNOSIS — M9903 Segmental and somatic dysfunction of lumbar region: Secondary | ICD-10-CM | POA: Diagnosis not present

## 2023-11-13 DIAGNOSIS — M9903 Segmental and somatic dysfunction of lumbar region: Secondary | ICD-10-CM | POA: Diagnosis not present

## 2023-11-13 DIAGNOSIS — M9906 Segmental and somatic dysfunction of lower extremity: Secondary | ICD-10-CM | POA: Diagnosis not present

## 2023-11-13 DIAGNOSIS — M9905 Segmental and somatic dysfunction of pelvic region: Secondary | ICD-10-CM | POA: Diagnosis not present

## 2023-11-13 DIAGNOSIS — M9902 Segmental and somatic dysfunction of thoracic region: Secondary | ICD-10-CM | POA: Diagnosis not present

## 2023-11-15 DIAGNOSIS — M9902 Segmental and somatic dysfunction of thoracic region: Secondary | ICD-10-CM | POA: Diagnosis not present

## 2023-11-15 DIAGNOSIS — M9905 Segmental and somatic dysfunction of pelvic region: Secondary | ICD-10-CM | POA: Diagnosis not present

## 2023-11-15 DIAGNOSIS — M9906 Segmental and somatic dysfunction of lower extremity: Secondary | ICD-10-CM | POA: Diagnosis not present

## 2023-11-15 DIAGNOSIS — M9903 Segmental and somatic dysfunction of lumbar region: Secondary | ICD-10-CM | POA: Diagnosis not present

## 2023-11-20 DIAGNOSIS — M9902 Segmental and somatic dysfunction of thoracic region: Secondary | ICD-10-CM | POA: Diagnosis not present

## 2023-11-20 DIAGNOSIS — M9903 Segmental and somatic dysfunction of lumbar region: Secondary | ICD-10-CM | POA: Diagnosis not present

## 2023-11-20 DIAGNOSIS — M9905 Segmental and somatic dysfunction of pelvic region: Secondary | ICD-10-CM | POA: Diagnosis not present

## 2023-11-20 DIAGNOSIS — M9906 Segmental and somatic dysfunction of lower extremity: Secondary | ICD-10-CM | POA: Diagnosis not present

## 2023-11-22 DIAGNOSIS — M9905 Segmental and somatic dysfunction of pelvic region: Secondary | ICD-10-CM | POA: Diagnosis not present

## 2023-11-22 DIAGNOSIS — M9902 Segmental and somatic dysfunction of thoracic region: Secondary | ICD-10-CM | POA: Diagnosis not present

## 2023-11-22 DIAGNOSIS — M9906 Segmental and somatic dysfunction of lower extremity: Secondary | ICD-10-CM | POA: Diagnosis not present

## 2023-11-22 DIAGNOSIS — M9903 Segmental and somatic dysfunction of lumbar region: Secondary | ICD-10-CM | POA: Diagnosis not present

## 2023-11-26 DIAGNOSIS — M9905 Segmental and somatic dysfunction of pelvic region: Secondary | ICD-10-CM | POA: Diagnosis not present

## 2023-11-26 DIAGNOSIS — M9902 Segmental and somatic dysfunction of thoracic region: Secondary | ICD-10-CM | POA: Diagnosis not present

## 2023-11-26 DIAGNOSIS — M9903 Segmental and somatic dysfunction of lumbar region: Secondary | ICD-10-CM | POA: Diagnosis not present

## 2023-11-26 DIAGNOSIS — M9906 Segmental and somatic dysfunction of lower extremity: Secondary | ICD-10-CM | POA: Diagnosis not present

## 2023-11-27 DIAGNOSIS — M9902 Segmental and somatic dysfunction of thoracic region: Secondary | ICD-10-CM | POA: Diagnosis not present

## 2023-11-27 DIAGNOSIS — M9906 Segmental and somatic dysfunction of lower extremity: Secondary | ICD-10-CM | POA: Diagnosis not present

## 2023-11-27 DIAGNOSIS — M9905 Segmental and somatic dysfunction of pelvic region: Secondary | ICD-10-CM | POA: Diagnosis not present

## 2023-11-27 DIAGNOSIS — M9903 Segmental and somatic dysfunction of lumbar region: Secondary | ICD-10-CM | POA: Diagnosis not present

## 2023-12-05 DIAGNOSIS — M9902 Segmental and somatic dysfunction of thoracic region: Secondary | ICD-10-CM | POA: Diagnosis not present

## 2023-12-05 DIAGNOSIS — M9906 Segmental and somatic dysfunction of lower extremity: Secondary | ICD-10-CM | POA: Diagnosis not present

## 2023-12-05 DIAGNOSIS — M9903 Segmental and somatic dysfunction of lumbar region: Secondary | ICD-10-CM | POA: Diagnosis not present

## 2023-12-05 DIAGNOSIS — M9905 Segmental and somatic dysfunction of pelvic region: Secondary | ICD-10-CM | POA: Diagnosis not present

## 2023-12-12 DIAGNOSIS — M9903 Segmental and somatic dysfunction of lumbar region: Secondary | ICD-10-CM | POA: Diagnosis not present

## 2023-12-12 DIAGNOSIS — M9902 Segmental and somatic dysfunction of thoracic region: Secondary | ICD-10-CM | POA: Diagnosis not present

## 2023-12-12 DIAGNOSIS — M7612 Psoas tendinitis, left hip: Secondary | ICD-10-CM | POA: Diagnosis not present

## 2023-12-12 DIAGNOSIS — M9905 Segmental and somatic dysfunction of pelvic region: Secondary | ICD-10-CM | POA: Diagnosis not present

## 2023-12-12 DIAGNOSIS — M9906 Segmental and somatic dysfunction of lower extremity: Secondary | ICD-10-CM | POA: Diagnosis not present

## 2023-12-19 DIAGNOSIS — M9905 Segmental and somatic dysfunction of pelvic region: Secondary | ICD-10-CM | POA: Diagnosis not present

## 2023-12-19 DIAGNOSIS — M9903 Segmental and somatic dysfunction of lumbar region: Secondary | ICD-10-CM | POA: Diagnosis not present

## 2023-12-19 DIAGNOSIS — M9902 Segmental and somatic dysfunction of thoracic region: Secondary | ICD-10-CM | POA: Diagnosis not present

## 2023-12-19 DIAGNOSIS — M7612 Psoas tendinitis, left hip: Secondary | ICD-10-CM | POA: Diagnosis not present

## 2023-12-19 DIAGNOSIS — M9906 Segmental and somatic dysfunction of lower extremity: Secondary | ICD-10-CM | POA: Diagnosis not present

## 2023-12-26 DIAGNOSIS — M7612 Psoas tendinitis, left hip: Secondary | ICD-10-CM | POA: Diagnosis not present

## 2023-12-26 DIAGNOSIS — M9902 Segmental and somatic dysfunction of thoracic region: Secondary | ICD-10-CM | POA: Diagnosis not present

## 2023-12-26 DIAGNOSIS — M9905 Segmental and somatic dysfunction of pelvic region: Secondary | ICD-10-CM | POA: Diagnosis not present

## 2023-12-26 DIAGNOSIS — M9903 Segmental and somatic dysfunction of lumbar region: Secondary | ICD-10-CM | POA: Diagnosis not present

## 2023-12-26 DIAGNOSIS — M9906 Segmental and somatic dysfunction of lower extremity: Secondary | ICD-10-CM | POA: Diagnosis not present

## 2024-01-16 DIAGNOSIS — M9902 Segmental and somatic dysfunction of thoracic region: Secondary | ICD-10-CM | POA: Diagnosis not present

## 2024-01-16 DIAGNOSIS — M9906 Segmental and somatic dysfunction of lower extremity: Secondary | ICD-10-CM | POA: Diagnosis not present

## 2024-01-16 DIAGNOSIS — M9903 Segmental and somatic dysfunction of lumbar region: Secondary | ICD-10-CM | POA: Diagnosis not present

## 2024-01-16 DIAGNOSIS — M9905 Segmental and somatic dysfunction of pelvic region: Secondary | ICD-10-CM | POA: Diagnosis not present

## 2024-01-22 ENCOUNTER — Encounter: Payer: Self-pay | Admitting: Emergency Medicine

## 2024-01-22 ENCOUNTER — Ambulatory Visit: Payer: Self-pay | Admitting: Emergency Medicine

## 2024-01-22 ENCOUNTER — Encounter: Admitting: Emergency Medicine

## 2024-01-22 ENCOUNTER — Ambulatory Visit: Payer: BC Managed Care – PPO | Admitting: Emergency Medicine

## 2024-01-22 VITALS — BP 122/80 | HR 88 | Temp 98.3°F | Ht 72.0 in | Wt 185.0 lb

## 2024-01-22 DIAGNOSIS — Z130 Encounter for screening for diseases of the blood and blood-forming organs and certain disorders involving the immune mechanism: Secondary | ICD-10-CM

## 2024-01-22 DIAGNOSIS — Z8042 Family history of malignant neoplasm of prostate: Secondary | ICD-10-CM

## 2024-01-22 DIAGNOSIS — E785 Hyperlipidemia, unspecified: Secondary | ICD-10-CM

## 2024-01-22 DIAGNOSIS — E782 Mixed hyperlipidemia: Secondary | ICD-10-CM

## 2024-01-22 DIAGNOSIS — F17200 Nicotine dependence, unspecified, uncomplicated: Secondary | ICD-10-CM

## 2024-01-22 DIAGNOSIS — Z13228 Encounter for screening for other metabolic disorders: Secondary | ICD-10-CM

## 2024-01-22 DIAGNOSIS — Z0001 Encounter for general adult medical examination with abnormal findings: Secondary | ICD-10-CM

## 2024-01-22 DIAGNOSIS — Z1329 Encounter for screening for other suspected endocrine disorder: Secondary | ICD-10-CM

## 2024-01-22 DIAGNOSIS — Z0000 Encounter for general adult medical examination without abnormal findings: Secondary | ICD-10-CM | POA: Diagnosis not present

## 2024-01-22 DIAGNOSIS — Z125 Encounter for screening for malignant neoplasm of prostate: Secondary | ICD-10-CM

## 2024-01-22 LAB — CBC WITH DIFFERENTIAL/PLATELET
Basophils Absolute: 0.1 K/uL (ref 0.0–0.1)
Basophils Relative: 1.6 % (ref 0.0–3.0)
Eosinophils Absolute: 0.1 K/uL (ref 0.0–0.7)
Eosinophils Relative: 2.7 % (ref 0.0–5.0)
HCT: 45.9 % (ref 39.0–52.0)
Hemoglobin: 15.6 g/dL (ref 13.0–17.0)
Lymphocytes Relative: 34.6 % (ref 12.0–46.0)
Lymphs Abs: 1.3 K/uL (ref 0.7–4.0)
MCHC: 34.1 g/dL (ref 30.0–36.0)
MCV: 88.5 fl (ref 78.0–100.0)
Monocytes Absolute: 0.5 K/uL (ref 0.1–1.0)
Monocytes Relative: 13.5 % — ABNORMAL HIGH (ref 3.0–12.0)
Neutro Abs: 1.7 K/uL (ref 1.4–7.7)
Neutrophils Relative %: 47.6 % (ref 43.0–77.0)
Platelets: 240 K/uL (ref 150.0–400.0)
RBC: 5.19 Mil/uL (ref 4.22–5.81)
RDW: 13 % (ref 11.5–15.5)
WBC: 3.6 K/uL — ABNORMAL LOW (ref 4.0–10.5)

## 2024-01-22 LAB — COMPREHENSIVE METABOLIC PANEL WITH GFR
ALT: 38 U/L (ref 0–53)
AST: 27 U/L (ref 5–37)
Albumin: 4.9 g/dL (ref 3.5–5.2)
Alkaline Phosphatase: 27 U/L — ABNORMAL LOW (ref 39–117)
BUN: 13 mg/dL (ref 6–23)
CO2: 29 meq/L (ref 19–32)
Calcium: 9.6 mg/dL (ref 8.4–10.5)
Chloride: 102 meq/L (ref 96–112)
Creatinine, Ser: 1.14 mg/dL (ref 0.40–1.50)
GFR: 79.73 mL/min (ref >60.00)
Glucose, Bld: 113 mg/dL — ABNORMAL HIGH (ref 70–99)
Potassium: 4.5 meq/L (ref 3.5–5.1)
Sodium: 139 meq/L (ref 135–145)
Total Bilirubin: 0.9 mg/dL (ref 0.2–1.2)
Total Protein: 6.9 g/dL (ref 6.0–8.3)

## 2024-01-22 LAB — PSA: PSA: 0.52 ng/mL (ref 0.10–4.00)

## 2024-01-22 LAB — LIPID PANEL
Cholesterol: 152 mg/dL (ref 28–200)
HDL: 56.1 mg/dL (ref >39.00)
LDL Cholesterol: 66 mg/dL (ref 0–99)
NonHDL: 95.66
Total CHOL/HDL Ratio: 3
Triglycerides: 147 mg/dL (ref 0.0–149.0)
VLDL: 29.4 mg/dL (ref 0.0–40.0)

## 2024-01-22 LAB — HEMOGLOBIN A1C: Hgb A1c MFr Bld: 5.3 % (ref 4.6–6.5)

## 2024-01-22 MED ORDER — ROSUVASTATIN CALCIUM 10 MG PO TABS: 10.0000 mg | ORAL_TABLET | Freq: Every day | ORAL | 3 refills | Status: AC

## 2024-01-22 NOTE — Assessment & Plan Note (Signed)
 No lower urinary tract symptoms PSA done today

## 2024-01-22 NOTE — Patient Instructions (Signed)
 Health Maintenance, Male  Adopting a healthy lifestyle and getting preventive care are important in promoting health and wellness. Ask your health care provider about:  The right schedule for you to have regular tests and exams.  Things you can do on your own to prevent diseases and keep yourself healthy.  What should I know about diet, weight, and exercise?  Eat a healthy diet    Eat a diet that includes plenty of vegetables, fruits, low-fat dairy products, and lean protein.  Do not eat a lot of foods that are high in solid fats, added sugars, or sodium.  Maintain a healthy weight  Body mass index (BMI) is a measurement that can be used to identify possible weight problems. It estimates body fat based on height and weight. Your health care provider can help determine your BMI and help you achieve or maintain a healthy weight.  Get regular exercise  Get regular exercise. This is one of the most important things you can do for your health. Most adults should:  Exercise for at least 150 minutes each week. The exercise should increase your heart rate and make you sweat (moderate-intensity exercise).  Do strengthening exercises at least twice a week. This is in addition to the moderate-intensity exercise.  Spend less time sitting. Even light physical activity can be beneficial.  Watch cholesterol and blood lipids  Have your blood tested for lipids and cholesterol at 41 years of age, then have this test every 5 years.  You may need to have your cholesterol levels checked more often if:  Your lipid or cholesterol levels are high.  You are older than 41 years of age.  You are at high risk for heart disease.  What should I know about cancer screening?  Many types of cancers can be detected early and may often be prevented. Depending on your health history and family history, you may need to have cancer screening at various ages. This may include screening for:  Colorectal cancer.  Prostate cancer.  Skin cancer.  Lung  cancer.  What should I know about heart disease, diabetes, and high blood pressure?  Blood pressure and heart disease  High blood pressure causes heart disease and increases the risk of stroke. This is more likely to develop in people who have high blood pressure readings or are overweight.  Talk with your health care provider about your target blood pressure readings.  Have your blood pressure checked:  Every 3-5 years if you are 41-41 years of age.  Every year if you are 3 years old or older.  If you are between the ages of 60 and 72 and are a current or former smoker, ask your health care provider if you should have a one-time screening for abdominal aortic aneurysm (AAA).  Diabetes  Have regular diabetes screenings. This checks your fasting blood sugar level. Have the screening done:  Once every three years after age 41 if you are at a normal weight and have a low risk for diabetes.  More often and at a younger age if you are overweight or have a high risk for diabetes.  What should I know about preventing infection?  Hepatitis B  If you have a higher risk for hepatitis B, you should be screened for this virus. Talk with your health care provider to find out if you are at risk for hepatitis B infection.  Hepatitis C  Blood testing is recommended for:  Everyone born from [birth year] through 1963.  Anyone  with known risk factors for hepatitis C.  Sexually transmitted infections (STIs)  You should be screened each year for STIs, including gonorrhea and chlamydia, if:  You are sexually active and are younger than 41 years of age.  You are older than 41 years of age and your health care provider tells you that you are at risk for this type of infection.  Your sexual activity has changed since you were last screened, and you are at increased risk for chlamydia or gonorrhea. Ask your health care provider if you are at risk.  Ask your health care provider about whether you are at high risk for HIV. Your health care provider  may recommend a prescription medicine to help prevent HIV infection. If you choose to take medicine to prevent HIV, you should first get tested for HIV. You should then be tested every 3 months for as long as you are taking the medicine.  Follow these instructions at home:  Alcohol use  Do not drink alcohol if your health care provider tells you not to drink.  If you drink alcohol:  Limit how much you have to 0-2 drinks a day.  Know how much alcohol is in your drink. In the [place] one drink equals one 12 oz bottle of beer (355 mL), one 5 oz glass of wine (148 mL), or one 1 oz glass of hard liquor (44 mL).  Lifestyle  Do not use any products that contain nicotine or tobacco. These products include cigarettes, chewing tobacco, and vaping devices, such as e-cigarettes. If you need help quitting, ask your health care provider.  Do not use street drugs.  Do not share needles.  Ask your health care provider for help if you need support or information about quitting drugs.  General instructions  Schedule regular health, dental, and eye exams.  Stay current with your vaccines.  Tell your health care provider if:  You often feel depressed.  You have ever been abused or do not feel safe at home.  Summary  Adopting a healthy lifestyle and getting preventive care are important in promoting health and wellness.  Follow your health care provider's instructions about healthy diet, exercising, and getting tested or screened for diseases.  Follow your health care provider's instructions on monitoring your cholesterol and blood pressure.  This information is not intended to replace advice given to you by your health care provider. Make sure you discuss any questions you have with your health care provider.  Document Revised: 06/14/2020 Document Reviewed: 06/14/2020  Elsevier Patient Education  2024 [employer].

## 2024-01-22 NOTE — Assessment & Plan Note (Signed)
 Cardiovascular and cancer risks associated with smoking discussed. Smoking cessation advice given.

## 2024-01-22 NOTE — Assessment & Plan Note (Signed)
 Diet and nutrition discussed Lipid profile done today Continues rosuvastatin  10 mg daily The 10-year ASCVD risk score (Arnett DK, et al., 2019) is: 1.8%   Values used to calculate the score:     Age: 43 years     Clinically relevant sex: Male     Is Non-Hispanic African American: No     Diabetic: No     Tobacco smoker: Yes     Systolic Blood Pressure: 122 mmHg     Is BP treated: No     HDL Cholesterol: 48.1 mg/dL     Total Cholesterol: 133 mg/dL

## 2024-01-22 NOTE — Progress Notes (Signed)
 Frank Galvan 43 y.o.   Chief Complaint  Patient presents with   Annual Exam    HISTORY OF PRESENT ILLNESS: This is a 43 y.o. male here for annual exam History of elevated triglycerides and cholesterol on rosuvastatin  History of prostate cancer in the family Still smoking 4 to 5 cigarettes/day Drinks occasionally and socially No other complaints or medical concerns today.  HPI   Prior to Admission medications  Medication Sig Start Date End Date Taking? Authorizing Provider  famotidine (PEPCID) 20 MG tablet Take 20 mg by mouth 2 (two) times daily.   Yes [provider]  omeprazole  (PRILOSEC) 40 MG capsule TAKE 1 CAPSULE BY MOUTH ONCE DAILY . APPOINTMENT REQUIRED FOR FUTURE REFILLS 04/21/22  Yes Abran Norleen SAILOR, MD  triamcinolone  cream (KENALOG ) 0.1 % Apply 1 Application topically 2 (two) times daily. To affected areas of rash 04/16/23  Yes Paci, Karina M, MD  rosuvastatin  (CRESTOR ) 10 MG tablet Take 1 tablet (10 mg total) by mouth daily. 01/22/24   Purcell Emil Schanz, MD    Allergies[1]  Patient Active Problem List   Diagnosis Date Noted   Family history of prostate cancer 01/18/2023   Elevated triglycerides with high cholesterol 05/07/2015    Past Medical History:  Diagnosis Date   Acid reflux    GERD (gastroesophageal reflux disease)    [employer] 01/13/2020   Perianal abscess     Past Surgical History:  Procedure Laterality Date   NO PAST SURGERIES     UPPER GASTROINTESTINAL ENDOSCOPY      Social History   Socioeconomic History   Marital status: Single    Spouse name: Not on file   Number of children: 0   Years of education: Not on file   Highest education level: Bachelor's degree (e.g., BA, AB, BS)  Occupational History   Occupation: Engineering  Tobacco Use   Smoking status: Every Day    Current packs/day: 0.12    Average packs/day: 0.1 packs/day for 16.0 years (1.9 ttl pk-yrs)    Types: Cigarettes   Smokeless tobacco: Former    Types: Chew   Substance and Sexual Activity   Alcohol use: Yes    Alcohol/week: 0.0 standard drinks of alcohol    Comment: 6 beers a day   Drug use: Yes    Types: Marijuana    Comment: rare social   Sexual activity: Yes    Partners: Female  Other Topics Concern   Not on file  Social History Narrative   Single   No children   Physicist, Medical   No current exercise   Social Drivers of Health   Tobacco Use: High Risk (01/22/2024)   Patient History    Smoking Tobacco Use: Every Day    Smokeless Tobacco Use: Former    Passive Exposure: Not on Actuary Strain: Low Risk (01/21/2024)   Overall Financial Resource Strain (CARDIA)    Difficulty of Paying Living Expenses: Not hard at all  Food Insecurity: No Food Insecurity (01/21/2024)   Epic    Worried About Radiation Protection Practitioner of Food in the Last Year: Never true    Ran Out of Food in the Last Year: Never true  Transportation Needs: No Transportation Needs (01/21/2024)   Epic    Lack of Transportation (Medical): No    Lack of Transportation (Non-Medical): No  Physical Activity: Insufficiently Active (01/21/2024)   Exercise Vital Sign    Days of Exercise per Week: 5 days    Minutes of Exercise  per Session: 20 min  Stress: Stress Concern Present (01/21/2024)   [employer] of Occupational Health - Occupational Stress Questionnaire    Feeling of Stress: To some extent  Social Connections: Unknown (01/21/2024)   Social Connection and Isolation Panel    Frequency of Communication with Friends and Family: Once a week    Frequency of Social Gatherings with Friends and Family: Patient declined    Attends Religious Services: 1 to 4 times per year    Active Member of [employer] or Organizations: No    Attends Engineer, Structural: Not on file    Marital Status: Never married  Intimate Partner Violence: Not on file  Depression (PHQ2-9): Low Risk (01/22/2024)   Depression (PHQ2-9)    PHQ-2 Score: 0  Alcohol Screen: Not on  file  Housing: Unknown (01/21/2024)   Epic    Unable to Pay for Housing in the Last Year: No    Number of Times Moved in the Last Year: Not on file    Homeless in the Last Year: No  Utilities: Not on file  Health Literacy: Not on file    Family History  Problem Relation Age of Onset   Hypertension Mother    Prostate cancer Father    Stroke Maternal Grandfather    Ovarian cancer Paternal Grandmother    Prostate cancer Paternal Grandfather    Colon cancer Neg Hx    Esophageal cancer Neg Hx    Rectal cancer Neg Hx    Stomach cancer Neg Hx      Review of Systems  Constitutional: Negative.  Negative for chills and fever.  HENT: Negative.  Negative for congestion and sore throat.   Respiratory: Negative.  Negative for cough and shortness of breath.   Cardiovascular: Negative.  Negative for chest pain and palpitations.  Gastrointestinal:  Negative for abdominal pain, diarrhea, nausea and vomiting.  Genitourinary: Negative.  Negative for dysuria and hematuria.  Skin: Negative.  Negative for rash.  Neurological: Negative.  Negative for dizziness and headaches.  All other systems reviewed and are negative.   Vitals:   01/22/24 0817  BP: 122/80  Pulse: 88  Temp: 98.3 F (36.8 C)  SpO2: 98%    Physical Exam Vitals reviewed.  Constitutional:      Appearance: Normal appearance.  HENT:     Head: Normocephalic.     Right Ear: Tympanic membrane, ear canal and external ear normal.     Left Ear: Tympanic membrane, ear canal and external ear normal.     Mouth/Throat:     Mouth: Mucous membranes are moist.     Pharynx: Oropharynx is clear.  Eyes:     Extraocular Movements: Extraocular movements intact.     Conjunctiva/sclera: Conjunctivae normal.     Pupils: Pupils are equal, round, and reactive to light.  Cardiovascular:     Rate and Rhythm: Normal rate and regular rhythm.     Pulses: Normal pulses.     Heart sounds: Normal heart sounds.  Pulmonary:     Effort: Pulmonary  effort is normal.     Breath sounds: Normal breath sounds.  Abdominal:     Palpations: Abdomen is soft.     Tenderness: There is no abdominal tenderness.  Musculoskeletal:     Cervical back: No tenderness.  Lymphadenopathy:     Cervical: No cervical adenopathy.  Skin:    General: Skin is warm and dry.     Capillary Refill: Capillary refill takes less than 2 seconds.  Neurological:  General: No focal deficit present.     Mental Status: He is alert and oriented to person, place, and time.  Psychiatric:        Mood and Affect: Mood normal.        Behavior: Behavior normal.      ASSESSMENT & PLAN: Problem List Items Addressed This Visit       Other   Elevated triglycerides with high cholesterol   Diet and nutrition discussed Lipid profile done today Continues rosuvastatin  10 mg daily The 10-year ASCVD risk score (Arnett DK, et al., 2019) is: 1.8%   Values used to calculate the score:     Age: 15 years     Clinically relevant sex: Male     Is Non-Hispanic African American: No     Diabetic: No     Tobacco smoker: Yes     Systolic Blood Pressure: 122 mmHg     Is BP treated: No     HDL Cholesterol: 48.1 mg/dL     Total Cholesterol: 133 mg/dL       Relevant Medications   rosuvastatin  (CRESTOR ) 10 MG tablet   Family history of prostate cancer   No lower urinary tract symptoms PSA done today      Relevant Orders   PSA   Current smoker   Cardiovascular and cancer risks associated with smoking discussed Smoking cessation advice given      Other Visit Diagnoses       Encounter for general adult medical examination with abnormal findings    -  Primary   Relevant Orders   Comprehensive metabolic panel with GFR   CBC with Differential/Platelet   Hemoglobin A1c   Lipid panel   PSA     Dyslipidemia       Relevant Medications   rosuvastatin  (CRESTOR ) 10 MG tablet   Other Relevant Orders   Comprehensive metabolic panel with GFR   CBC with Differential/Platelet    Hemoglobin A1c   Lipid panel     Screening for prostate cancer       Relevant Orders   PSA     Screening for deficiency anemia       Relevant Orders   CBC with Differential/Platelet     Screening for endocrine, metabolic and immunity disorder       Relevant Orders   Comprehensive metabolic panel with GFR   Hemoglobin A1c      Modifiable risk factors discussed with patient. Anticipatory guidance according to age provided. The following topics were also discussed: Social Determinants of Health Smoking and cardiovascular/cancer risks associated with this condition Diet and nutrition Benefits of exercise Cancer family history review Vaccinations review and recommendations Cardiovascular risk assessment and need for blood work Mental health including depression and anxiety Fall and accident prevention  Patient Instructions  Health Maintenance, Male Adopting a healthy lifestyle and getting preventive care are important in promoting health and wellness. Ask your health care provider about: The right schedule for you to have regular tests and exams. Things you can do on your own to prevent diseases and keep yourself healthy. What should I know about diet, weight, and exercise? Eat a healthy diet  Eat a diet that includes plenty of vegetables, fruits, low-fat dairy products, and lean protein. Do not eat a lot of foods that are high in solid fats, added sugars, or sodium. Maintain a healthy weight Body mass index (BMI) is a measurement that can be used to identify possible weight problems. It estimates body fat  based on height and weight. Your health care provider can help determine your BMI and help you achieve or maintain a healthy weight. Get regular exercise Get regular exercise. This is one of the most important things you can do for your health. Most adults should: Exercise for at least 150 minutes each week. The exercise should increase your heart rate and make you sweat  (moderate-intensity exercise). Do strengthening exercises at least twice a week. This is in addition to the moderate-intensity exercise. Spend less time sitting. Even light physical activity can be beneficial. Watch cholesterol and blood lipids Have your blood tested for lipids and cholesterol at 41 years of age, then have this test every 5 years. You may need to have your cholesterol levels checked more often if: Your lipid or cholesterol levels are high. You are older than 41 years of age. You are at high risk for heart disease. What should I know about cancer screening? Many types of cancers can be detected early and may often be prevented. Depending on your health history and family history, you may need to have cancer screening at various ages. This may include screening for: Colorectal cancer. Prostate cancer. Skin cancer. Lung cancer. What should I know about heart disease, diabetes, and high blood pressure? Blood pressure and heart disease High blood pressure causes heart disease and increases the risk of stroke. This is more likely to develop in people who have high blood pressure readings or are overweight. Talk with your health care provider about your target blood pressure readings. Have your blood pressure checked: Every 3-5 years if you are 5-17 years of age. Every year if you are 21 years old or older. If you are between the ages of 40 and 39 and are a current or former smoker, ask your health care provider if you should have a one-time screening for abdominal aortic aneurysm (AAA). Diabetes Have regular diabetes screenings. This checks your fasting blood sugar level. Have the screening done: Once every three years after age 20 if you are at a normal weight and have a low risk for diabetes. More often and at a younger age if you are overweight or have a high risk for diabetes. What should I know about preventing infection? Hepatitis B If you have a higher risk for  hepatitis B, you should be screened for this virus. Talk with your health care provider to find out if you are at risk for hepatitis B infection. Hepatitis C Blood testing is recommended for: Everyone born from [birth year] through 1963. Anyone with known risk factors for hepatitis C. Sexually transmitted infections (STIs) You should be screened each year for STIs, including gonorrhea and chlamydia, if: You are sexually active and are younger than 41 years of age. You are older than 41 years of age and your health care provider tells you that you are at risk for this type of infection. Your sexual activity has changed since you were last screened, and you are at increased risk for chlamydia or gonorrhea. Ask your health care provider if you are at risk. Ask your health care provider about whether you are at high risk for HIV. Your health care provider may recommend a prescription medicine to help prevent HIV infection. If you choose to take medicine to prevent HIV, you should first get tested for HIV. You should then be tested every 3 months for as long as you are taking the medicine. Follow these instructions at home: Alcohol use Do not drink alcohol if  your health care provider tells you not to drink. If you drink alcohol: Limit how much you have to 0-2 drinks a day. Know how much alcohol is in your drink. In the [place] one drink equals one 12 oz bottle of beer (355 mL), one 5 oz glass of wine (148 mL), or one 1 oz glass of hard liquor (44 mL). Lifestyle Do not use any products that contain nicotine or tobacco. These products include cigarettes, chewing tobacco, and vaping devices, such as e-cigarettes. If you need help quitting, ask your health care provider. Do not use street drugs. Do not share needles. Ask your health care provider for help if you need support or information about quitting drugs. General instructions Schedule regular health, dental, and eye exams. Stay current with your  vaccines. Tell your health care provider if: You often feel depressed. You have ever been abused or do not feel safe at home. Summary Adopting a healthy lifestyle and getting preventive care are important in promoting health and wellness. Follow your health care provider's instructions about healthy diet, exercising, and getting tested or screened for diseases. Follow your health care provider's instructions on monitoring your cholesterol and blood pressure. This information is not intended to replace advice given to you by your health care provider. Make sure you discuss any questions you have with your health care provider. Document Revised: 06/14/2020 Document Reviewed: 06/14/2020 Elsevier Patient Education  2024 [employer]      Emil Schaumann, MD [facility]    [1] No Known Allergies

## 2024-01-23 DIAGNOSIS — M9906 Segmental and somatic dysfunction of lower extremity: Secondary | ICD-10-CM | POA: Diagnosis not present

## 2024-01-23 DIAGNOSIS — M9902 Segmental and somatic dysfunction of thoracic region: Secondary | ICD-10-CM | POA: Diagnosis not present

## 2024-01-23 DIAGNOSIS — M9905 Segmental and somatic dysfunction of pelvic region: Secondary | ICD-10-CM | POA: Diagnosis not present

## 2024-01-23 DIAGNOSIS — M9903 Segmental and somatic dysfunction of lumbar region: Secondary | ICD-10-CM | POA: Diagnosis not present
